# Patient Record
Sex: Female | Born: 1975 | Race: White | Hispanic: No | Marital: Married | Smoking: Current every day smoker
Health system: Southern US, Community
[De-identification: ages and names within clinical notes are randomized; demographics above are authoritative.]

## PROBLEM LIST (undated history)

## (undated) DIAGNOSIS — Z8489 Family history of other specified conditions: Secondary | ICD-10-CM

## (undated) DIAGNOSIS — F8081 Childhood onset fluency disorder: Secondary | ICD-10-CM

## (undated) DIAGNOSIS — F419 Anxiety disorder, unspecified: Secondary | ICD-10-CM

## (undated) HISTORY — DX: Anxiety disorder, unspecified: F41.9

## (undated) HISTORY — DX: Childhood onset fluency disorder: F80.81

---

## 2002-03-07 ENCOUNTER — Other Ambulatory Visit: Admission: RE | Admit: 2002-03-07 | Discharge: 2002-03-07 | Payer: Self-pay | Admitting: Obstetrics and Gynecology

## 2002-10-14 ENCOUNTER — Inpatient Hospital Stay (HOSPITAL_COMMUNITY): Admission: AD | Admit: 2002-10-14 | Discharge: 2002-10-17 | Payer: Self-pay | Admitting: Obstetrics and Gynecology

## 2002-10-15 ENCOUNTER — Encounter (INDEPENDENT_AMBULATORY_CARE_PROVIDER_SITE_OTHER): Payer: Self-pay | Admitting: Specialist

## 2003-06-10 ENCOUNTER — Other Ambulatory Visit: Admission: RE | Admit: 2003-06-10 | Discharge: 2003-06-10 | Payer: Self-pay | Admitting: Obstetrics and Gynecology

## 2004-07-20 ENCOUNTER — Other Ambulatory Visit: Admission: RE | Admit: 2004-07-20 | Discharge: 2004-07-20 | Payer: Self-pay | Admitting: Obstetrics and Gynecology

## 2005-07-21 ENCOUNTER — Other Ambulatory Visit: Admission: RE | Admit: 2005-07-21 | Discharge: 2005-07-21 | Payer: Self-pay | Admitting: Obstetrics and Gynecology

## 2007-05-23 ENCOUNTER — Inpatient Hospital Stay (HOSPITAL_COMMUNITY): Admission: AD | Admit: 2007-05-23 | Discharge: 2007-05-27 | Payer: Self-pay | Admitting: Obstetrics & Gynecology

## 2007-05-28 ENCOUNTER — Encounter: Admission: RE | Admit: 2007-05-28 | Discharge: 2007-06-27 | Payer: Self-pay | Admitting: Obstetrics & Gynecology

## 2007-06-28 ENCOUNTER — Encounter: Admission: RE | Admit: 2007-06-28 | Discharge: 2007-07-28 | Payer: Self-pay | Admitting: Obstetrics and Gynecology

## 2007-07-29 ENCOUNTER — Encounter: Admission: RE | Admit: 2007-07-29 | Discharge: 2007-08-27 | Payer: Self-pay | Admitting: Obstetrics and Gynecology

## 2007-08-28 ENCOUNTER — Encounter: Admission: RE | Admit: 2007-08-28 | Discharge: 2007-09-27 | Payer: Self-pay | Admitting: Obstetrics and Gynecology

## 2007-09-28 ENCOUNTER — Encounter: Admission: RE | Admit: 2007-09-28 | Discharge: 2007-10-28 | Payer: Self-pay | Admitting: Obstetrics and Gynecology

## 2007-10-29 ENCOUNTER — Encounter: Admission: RE | Admit: 2007-10-29 | Discharge: 2007-11-05 | Payer: Self-pay | Admitting: Obstetrics and Gynecology

## 2011-02-08 NOTE — Op Note (Signed)
Anna Richardson, Anna Richardson             ACCOUNT NO.:  [id number]   MEDICAL RECORD NO.:  [id number]          PATIENT TYPE:  INP   LOCATION:  9302                          FACILITY:  [facility]   PHYSICIAN:  Gerrit Friends. Aldona Bar, M.D.   DATE OF BIRTH:  08/11/1976   DATE OF PROCEDURE:  05/23/2007  DATE OF DISCHARGE:                               OPERATIVE REPORT   PREOPERATIVE DIAGNOSIS:  36-week plus intrauterine pregnancy, previous  cesarean section, spontaneous rupture of membranes, desire for repeat  cesarean section.   POSTOPERATIVE DIAGNOSIS:  36-week plus intrauterine pregnancy, previous  cesarean section, spontaneous rupture of membranes, desire for repeat  cesarean section, delivery of 7 pound female infant, Apgars 8/9.   PROCEDURE:  Repeat low transverse cesarean section.   SURGEON:  Gerrit Friends. Aldona Bar, M.D.   ANESTHESIA:  Subarachnoid block.   HISTORY:  This 35 year old gravida 2, para 1, was originally scheduled  for repeat cesarean section on September 18.  Due date was September 27.  At approximately 6:20 p.m. on the evening of August 27, she had  spontaneous rupture of membranes - clear fluid, and presented to triage  for evaluation.  She was found to have ruptured membranes.  Her group B  Strep status was unknown and, therefore, she received a dose of 2 grams  of ampicillin at least several hours preoperatively.  Fetal heart rate  was, indeed, reactive and the patient was having some contractions.  She  was ultimately taken to the operating room for cesarean section, repeat,  as requested.   DESCRIPTION OF PROCEDURE:  The patient was taken to the operating room  where, after the satisfactory induction of spinal anesthetic, she was  prepped and draped in the usual fashion with a Foley catheter inserted.  At this time, after the patient was adequately draped and good  anesthetic levels were documented, the procedure was begun.  A  Pfannenstiel incision was made dissecting down sharply to  and through  the old scar without difficulty.  Hemostasis was created in each layer.  The fascia was incised in a low transverse fashion, subfascial space was  created inferiorly and superiorly, muscles separated in the midline, the  peritoneum identified and appropriate care taken to avoid the bowel  superiorly and the bladder inferiorly.   At this time, the vesicouterine peritoneum was incised a low transverse  fashion and pushed off the lower uterine segment with ease.  Sharp  incision of the uterus was then made in a low transverse fashion with  Metzenbaum scissors and extended laterally.  The remaining amniotic  fluid was drained and, at this time with minimal difficulty, a viable  female infant was delivered from vertex position.  The infant cried  spontaneously at once and after the cord was clamped and cut, the infant  was passed off to the awaiting team and, thereafter, taken to the  regular nursery in good condition.  Weight was 7 pounds even and Apgars  were noted to be 8/9.   After cord bloods were collected, the placenta was delivered intact.  The uterus was then exteriorized, rendered free of any  remaining  products conception, and with good uterine contractility afforded by  slowly giving intravenous Pitocin and manual stimulation, the uterine  incision was then closed using a single layer of #1 Vicryl in a running  locking fashion.  This was oversewn with several figure-of-eight #1  Vicryls.  The tubes and ovaries appeared normal and, at this time, the  uterine incision was noted to be dry and the uterus was well contracted.  The uterus, at this time, was replaced into the abdominal incision after  the abdomen was lavaged of all free blood and clot.   With all counts being correct and no foreign bodies noted remaining in  the abdominal cavity, closure of the abdomen was begun in layers.  The  abdominal peritoneum was closed with 0 Vicryl in a running fashion and  muscles  secured with same.  Assured of good fascial hemostasis, the  fascia was then reapproximated with 0 Vicryl from angle to midline  bilaterally.  The subcutaneous tissues were rendered hemostatic and  staples were used to close the skin.  A sterile pressure dressing was  applied and the patient, at this time, was transported to recovery in  satisfactory condition having tolerated the procedure well.  Estimated  blood loss 500 mL.  All counts correct x2.   CONCLUSION:  This patient presented with ruptured membranes at 36 weeks  plus gestation having had a previous cesarean section and was desirous  of a repeat cesarean section and, indeed, was even contracting some at  the time that she presented to the triage area.  She was taken to the  operating room and delivered by repeat low transverse cesarean section  of a 7 pounds female infant with Apgars of 8/9 and at the conclusion of  the procedure, both mother and baby were doing well in their respective  recovery areas.      Gerrit Friends. Aldona Bar, M.D.  Electronically Signed     RMW/MEDQ  D:  05/23/2007  T:  05/24/2007  Job:  [id number]

## 2011-02-08 NOTE — Discharge Summary (Signed)
Anna Richardson, Anna Richardson             ACCOUNT NO.:  [id number]   MEDICAL RECORD NO.:  [id number]          PATIENT TYPE:  INP   LOCATION:  9302                          [facility]ACILITY:  [facility]   PHYSICIAN:  Carrington Clamp, M.D. DATE OF BIRTH:  1976/05/13   DATE OF ADMISSION:  05/23/2007  DATE OF DISCHARGE:  05/27/2007                               DISCHARGE SUMMARY   FINAL DIAGNOSES:  1. Intrauterine pregnancy at 36+ weeks gestation.  2. History of previous cesarean section.  3. The patient desires repeat cesarean section.  4. Spontaneous rupture of membranes.   PROCEDURE:  Repeat low transverse cesarean section.   SURGEON:  Dr. Annamaria Helling.   COMPLICATIONS:  None.   HISTORY OF PRESENT ILLNESS:  This is 35 year old G2, P1 was originally  scheduled for a repeat cesarean section on September 18, but on the  evening of August 27, the patient had spontaneous rupture of membranes  and presented to the [facility] for evaluation.  The patient's  antepartum course up to this point had been uncomplicated.  Her first  born child did have a cleft lip and she was monitored with ultrasounds  at [facility], which showed no signs of any abnormalities.   HOSPITAL COURSE:  She presents at this time to the [facility] with  rupture of membranes.  Her group B strep status was unknown and  therefore she was started on 2 g of ampicillin.  At this point, a  discussion was held with the patient regarding her options and a  decision was made to proceed with a repeat cesarean section.  She was  taken to the operating room on May 23, 2007 by Dr. Annamaria Helling, where  a repeat low transverse cesarean section was performed with the delivery  of a 7-pound 0-ounce female infant with Apgars of 8 and 9.  Delivery went  without complications.  The baby did go to the NICU for some breathing  difficulties.  The patient's postoperative course was benign without any  significant fevers.  She was felt ready for  discharge on postoperative  day #4.  The baby was still in the NICU.  She was sent home on a regular  diet, told to decrease activities, told to continue her vitamins and was  given prescriptions for her pain medicines; I do not see which ones were  given.  She was to follow up in our office in 4-6 weeks.  She will be  staying at the hospital with the baby in the NICU.   LABORATORY DATA ON DISCHARGE:  At this point, she has a hemoglobin of  10.2, white blood cell count of 12.8 and platelets of 222,000.      Leilani Able, P.A.-C.      Carrington Clamp, M.D.  Electronically Signed    MB/MEDQ  D:  07/04/2007  T:  07/05/2007  Job:  [id number]

## 2011-05-31 ENCOUNTER — Other Ambulatory Visit: Payer: Self-pay | Admitting: Obstetrics and Gynecology

## 2011-07-08 LAB — CBC
HCT: 29.7 — ABNORMAL LOW
Hemoglobin: 11.4 — ABNORMAL LOW
MCHC: 35
MCV: 89.4
MCV: 89.9
Platelets: 222
RBC: 3.63 — ABNORMAL LOW
RDW: 13.2
RDW: 13.7

## 2011-07-08 LAB — RPR: RPR Ser Ql: NONREACTIVE

## 2012-02-05 ENCOUNTER — Ambulatory Visit (INDEPENDENT_AMBULATORY_CARE_PROVIDER_SITE_OTHER): Payer: 59 | Admitting: Physician Assistant

## 2012-02-05 VITALS — BP 127/85 | HR 99 | Temp 99.5°F | Resp 16 | Ht 62.5 in | Wt 198.0 lb

## 2012-02-05 DIAGNOSIS — J029 Acute pharyngitis, unspecified: Secondary | ICD-10-CM

## 2012-02-05 DIAGNOSIS — R509 Fever, unspecified: Secondary | ICD-10-CM

## 2012-02-05 LAB — POCT CBC
Granulocyte percent: 81.3 %G — AB (ref 37–80)
HCT, POC: 40.9 % (ref 37.7–47.9)
Hemoglobin: 13.4 g/dL (ref 12.2–16.2)
POC Granulocyte: 5.7 (ref 2–6.9)

## 2012-02-05 MED ORDER — MAGIC MOUTHWASH W/LIDOCAINE: 5.0000 mL | ORAL | Status: DC | PRN

## 2012-02-05 MED ORDER — AMOXICILLIN 875 MG PO TABS: 875.0000 mg | ORAL_TABLET | Freq: Two times a day (BID) | ORAL | Status: AC

## 2012-02-05 NOTE — Progress Notes (Signed)
  Subjective:    Patient ID: Anna Richardson, female    DOB: [birth year], 36 y.o.   MRN: [id number]  HPI  Anna Richardson comes in today c/o myalgias, fever (tmax 101), ST and cough for 3 days.  One episode of diarrhea but no n/v.  No SOB, wheezing.  She is a Manufacturing systems engineer.  She smokes 1/2 ppd and is healthy otherwise. She did not have a flu shot this year. No tick exposure.  Review of Systems  Constitutional: Positive for fever, chills and fatigue.  HENT: Positive for sore throat. Negative for congestion.   Respiratory: Positive for cough.   Gastrointestinal: Positive for diarrhea. Negative for nausea and vomiting.  Musculoskeletal: Positive for myalgias.  Neurological: Positive for headaches.       Objective:   Physical Exam  Constitutional: She is oriented to person, place, and time. She appears well-developed and well-nourished.  HENT:  Right Ear: Tympanic membrane normal.  Left Ear: Tympanic membrane normal.  Nose: No mucosal edema.  Mouth/Throat: Oropharyngeal exudate (one spot on left tonsil), posterior oropharyngeal edema and posterior oropharyngeal erythema present.  Cardiovascular: Normal rate and regular rhythm.   Pulmonary/Chest: Effort normal and breath sounds normal.  Lymphadenopathy:    She has no cervical adenopathy.  Neurological: She is alert and oriented to person, place, and time.  Skin: Skin is warm.     Results for orders placed in visit on 02/05/12  POCT RAPID STREP A (OFFICE)      Component Value Range   Rapid Strep A Screen Negative  Negative   POCT CBC      Component Value Range   WBC 7.0  4.6 - 10.2 (K/uL)   Lymph, poc 0.9  0.6 - 3.4    POC LYMPH PERCENT 12.6  10 - 50 (%L)   MID (cbc) 0.4  0 - 0.9    POC MID % 6.1  0 - 12 (%M)   POC Granulocyte 5.7  2 - 6.9    Granulocyte percent 81.3 (*) 37 - 80 (%G)   RBC 4.35  4.04 - 5.48 (M/uL)   Hemoglobin 13.4  12.2 - 16.2 (g/dL)   HCT, POC 30.8  65.7 - 47.9 (%)   MCV 94.1  80 - 97 (fL)   MCH, POC  30.8  27 - 31.2 (pg)   MCHC 32.8  31.8 - 35.4 (g/dL)   RDW, POC 84.6     Platelet Count, POC 267  142 - 424 (K/uL)   MPV 9.1  0 - 99.8 (fL)        Assessment & Plan:  Fever Myalgias Pharyngitis  Amoxicillin, [employer] Magic MW, motrin.  Out of work tomorrow.  Call if symptoms worsen.

## 2012-02-07 ENCOUNTER — Ambulatory Visit (INDEPENDENT_AMBULATORY_CARE_PROVIDER_SITE_OTHER): Payer: 59 | Admitting: Physician Assistant

## 2012-02-07 VITALS — BP 127/80 | HR 82 | Temp 98.2°F | Resp 16 | Ht 64.5 in | Wt 185.0 lb

## 2012-02-07 DIAGNOSIS — J019 Acute sinusitis, unspecified: Secondary | ICD-10-CM

## 2012-02-07 DIAGNOSIS — R059 Cough, unspecified: Secondary | ICD-10-CM

## 2012-02-07 DIAGNOSIS — R05 Cough: Secondary | ICD-10-CM

## 2012-02-07 DIAGNOSIS — J029 Acute pharyngitis, unspecified: Secondary | ICD-10-CM

## 2012-02-07 MED ORDER — HYDROCOD POLST-CHLORPHEN POLST 10-8 MG/5ML PO LQCR: 5.0000 mL | Freq: Two times a day (BID) | ORAL | Status: DC | PRN

## 2012-02-07 MED ORDER — CEFDINIR 300 MG PO CAPS: 600.0000 mg | ORAL_CAPSULE | Freq: Every day | ORAL | Status: AC

## 2012-02-07 MED ORDER — GUAIFENESIN ER 1200 MG PO TB12: 1.0000 | ORAL_TABLET | Freq: Two times a day (BID) | ORAL | Status: DC | PRN

## 2012-02-07 MED ORDER — PREDNISONE 20 MG PO TABS: ORAL_TABLET | ORAL | Status: AC

## 2012-02-07 NOTE — Progress Notes (Signed)
  Subjective:    Patient ID: Anna Richardson, female    DOB: March 08, 1976, 36 y.o.   MRN: [id number]  HPI Presents with continued illness. Seen 02/04/2012 with sore throat, fever, body aches.  RS was negative and CBC normal, but covered for strep pharyngitis with Amoxicillin.  Has not used the MMW.  Uses ibuprofen/acetaminophen for throat pain. Feels no better. Low grade fever continues.  Some post-nasal drainage. Cough is non-productive, but keeps her awake at night. No additional GI symptoms.  No GU symptoms.  Review of Systems As above.    Objective:   Physical Exam Vital signs noted. Well-developed, well nourished WF who is awake, alert and oriented, in NAD. HEENT: [place]/AT, PERRL, EOMI.  Sclera and conjunctiva are clear.  EAC are patent, TMs are normal in appearance. Nasal mucosa is pink and moist. OP is clear, though mildly erythematous.  No exudates present today. Tenderness with palpation over the maxillary sinuses. Neck: supple, no lymphadenopathy, thyromegaly. Mildly tender over the left tonsillar area, but no discrete lymphadenopathy. Heart: RRR, no murmur Lungs: CTA Extremities: no cyanosis, clubbing or edema. Skin: warm and dry without rash.     Assessment & Plan:   1. Acute sinusitis, unspecified  cefdinir (OMNICEF) 300 MG capsule, Guaifenesin (MUCINEX MAXIMUM STRENGTH) 1200 MG TB12, predniSONE (DELTASONE) 20 MG tablet  2. Acute pharyngitis  predniSONE (DELTASONE) 20 MG tablet  3. Cough  chlorpheniramine-HYDROcodone (TUSSIONEX PENNKINETIC ER) 10-8 MG/5ML LQCR  Anticipatory guidance.  Supportive care. Re-evaluate if symptoms worsen/persist.

## 2012-02-07 NOTE — Patient Instructions (Signed)
Get lots of rest and drink at least 64 ounces of water daily. Do not take ibuprofen while you are taking the prednisone, but you may safely take acetaminophen (Tylenol).

## 2012-12-26 ENCOUNTER — Ambulatory Visit (INDEPENDENT_AMBULATORY_CARE_PROVIDER_SITE_OTHER): Payer: 59 | Admitting: Family Medicine

## 2012-12-26 VITALS — BP 122/80 | HR 63 | Temp 98.0°F | Resp 16 | Ht 64.0 in | Wt 193.0 lb

## 2012-12-26 DIAGNOSIS — R197 Diarrhea, unspecified: Secondary | ICD-10-CM

## 2012-12-26 DIAGNOSIS — K5289 Other specified noninfective gastroenteritis and colitis: Secondary | ICD-10-CM

## 2012-12-26 DIAGNOSIS — K529 Noninfective gastroenteritis and colitis, unspecified: Secondary | ICD-10-CM

## 2012-12-26 LAB — POCT CBC
Granulocyte percent: 72.3 %G (ref 37–80)
MCH, POC: 29.9 pg (ref 27–31.2)
MID (cbc): 0.3 (ref 0–0.9)
MPV: 9 fL (ref 0–99.8)
POC MID %: 6.1 %M (ref 0–12)
Platelet Count, POC: 270 10*3/uL (ref 142–424)
RBC: 3.88 M/uL — AB (ref 4.04–5.48)
WBC: 5.2 10*3/uL (ref 4.6–10.2)

## 2012-12-26 MED ORDER — LOPERAMIDE HCL 2 MG PO CAPS: 2.0000 mg | ORAL_CAPSULE | Freq: Four times a day (QID) | ORAL | Status: DC | PRN

## 2012-12-26 NOTE — Patient Instructions (Addendum)
Iron-Rich Diet  An iron-rich diet contains foods that are good sources of iron. Iron is an important mineral that helps your body produce hemoglobin. Hemoglobin is a protein in red blood cells that carries oxygen to the body's tissues. Sometimes, the iron level in your blood can be low. This may be caused by:  · A lack of iron in your diet.  · Blood loss.  · Times of growth, such as during pregnancy or during a child's growth and development.  Low levels of iron can cause a decrease in the number of red blood cells. This can result in iron deficiency anemia. Iron deficiency anemia symptoms include:  · Tiredness.  · Weakness.  · Irritability.  · Increased chance of infection.  Here are some recommendations for daily iron intake:  · Males older than 36 years of age need 8 mg of iron per day.  · Women ages 19 to 50 need 18 mg of iron per day.  · Pregnant women need 27 mg of iron per day, and women who are over 19 years of age and breastfeeding need 9 mg of iron per day.  · Women over the age of 50 need 8 mg of iron per day.  SOURCES OF IRON  There are 2 types of iron that are found in food: heme iron and nonheme iron. Heme iron is absorbed by the body better than nonheme iron. Heme iron is found in meat, poultry, and fish. Nonheme iron is found in grains, beans, and vegetables.  Heme Iron Sources  Food / Iron (mg)  · Chicken liver, 3 oz (85 g)/ 10 mg  · Beef liver, 3 oz (85 g)/ 5.5 mg  · Oysters, 3 oz (85 g)/ 8 mg  · Beef, 3 oz (85 g)/ 2 to 3 mg  · Shrimp, 3 oz (85 g)/ 2.8 mg  · Turkey, 3 oz (85 g)/ 2 mg  · Chicken, 3 oz (85 g) / 1 mg  · Fish (tuna, halibut), 3 oz (85 g)/ 1 mg  · Pork, 3 oz (85 g)/ 0.9 mg  Nonheme Iron Sources  Food / Iron (mg)  · Ready-to-eat breakfast cereal, iron-fortified / 3.9 to 7 mg  · Tofu, ½ cup / 3.4 mg  · Kidney beans, ½ cup / 2.6 mg  · Baked potato with skin / 2.7 mg  · Asparagus, ½ cup / 2.2 mg  · Avocado / 2 mg  · Dried peaches, ½ cup / 1.6 mg  · Raisins, ½ cup / 1.5 mg  · Soy milk, 1 cup  / 1.5 mg  · Whole-wheat bread, 1 slice / 1.2 mg  · Spinach, 1 cup / 0.8 mg  · Broccoli, ½ cup / 0.6 mg  IRON ABSORPTION  Certain foods can decrease the body's absorption of iron. Try to avoid these foods and beverages while eating meals with iron-containing foods:  · Coffee.  · Tea.  · Fiber.  · Soy.  Foods containing vitamin C can help increase the amount of iron your body absorbs from iron sources, especially from nonheme sources. Eat foods with vitamin C along with iron-containing foods to increase your iron absorption. Foods that are high in vitamin C include many fruits and vegetables. Some good sources are:  · Fresh orange juice.  · Oranges.  · Strawberries.  · Mangoes.  · Grapefruit.  · Red bell peppers.  · Green bell peppers.  · Broccoli.  · Potatoes with skin.  · Tomato juice.  Document 

## 2012-12-26 NOTE — Progress Notes (Signed)
Subjective:    Patient ID: Anna Richardson, female    DOB: September 30, 1975, 37 y.o.   MRN: [id number] Chief Complaint  Patient presents with  . Emesis    since sunday  . Diarrhea  . Generalized Body Aches  . Headache     HPI Stomach pains since and diarrhea since Sun - 3 days ago so today is day 4 and the diarrhea does not seem to be slacking off - though she currently has not had a BM in 4 hours which is a sig improvement.  Did vomit one time only on Monday. Otherwise is able to tolerate po - just not much of an appetite due to stomach pains. Is trying to push fluids but hard as they are just running right through her. Did feel chills and achy, some sweats. She is a Manufacturing systems engineer.  Stools are large volume and has about 10 episodes throughout the day - they are orange, watery, and odiferous.  Abd pain is central and colicky - worse when she needs to have a stool. No recent antibiotic use.   No travels or unusual foods. No otc meds.   Is on day 4 of illness and not slowing down.   Past Medical History  Diagnosis Date  . Anxiety     situational  . Stutter    Current Outpatient Prescriptions on File Prior to Visit  Medication Sig Dispense Refill  . norgestimate-ethinyl estradiol (ORTHO-CYCLEN,SPRINTEC,PREVIFEM) 0.25-35 MG-MCG tablet Take 1 tablet by mouth daily.      . Alum & Mag Hydroxide-Simeth (MAGIC MOUTHWASH W/LIDOCAINE) SOLN Take 5 mLs by mouth as needed.  120 mL  0  . chlorpheniramine-HYDROcodone (TUSSIONEX PENNKINETIC ER) 10-8 MG/5ML LQCR Take 5 mLs by mouth every 12 (twelve) hours as needed (cough).  60 mL  0  . Guaifenesin (MUCINEX MAXIMUM STRENGTH) 1200 MG TB12 Take 1 tablet (1,200 mg total) by mouth every 12 (twelve) hours as needed.  14 tablet  1   No current facility-administered medications on file prior to visit.   Allergies  Allergen Reactions  . Ciprofloxacin     EYE DROPS Throat swelling     Review of Systems  Constitutional: Positive for chills, diaphoresis,  activity change, appetite change and fatigue. Negative for fever and unexpected weight change.  Respiratory: Negative for shortness of breath.   Cardiovascular: Negative for chest pain and leg swelling.  Gastrointestinal: Positive for nausea, vomiting, abdominal pain and diarrhea. Negative for constipation, blood in stool, abdominal distention, anal bleeding and rectal pain.  Genitourinary: Negative for dysuria, decreased urine volume and difficulty urinating.  Musculoskeletal: Positive for myalgias. Negative for gait problem.  Skin: Negative for rash.  Hematological: Negative for adenopathy.  Psychiatric/Behavioral: Positive for sleep disturbance.      BP 122/80  Pulse 63  Temp(Src) 98 F (36.7 C) (Oral)  Resp 16  Ht 5\' 4"  (1.626 m)  Wt 193 lb (87.544 kg)  BMI 33.11 kg/m2  SpO2 98%  LMP 12/12/2012 Objective:   Physical Exam  Constitutional: She is oriented to person, place, and time. She appears well-developed and well-nourished. No distress.  HENT:  Head: Normocephalic and atraumatic.  Neck: Normal range of motion. Neck supple. No thyromegaly present.  Cardiovascular: Normal rate, regular rhythm, normal heart sounds and intact distal pulses.   Pulmonary/Chest: Effort normal and breath sounds normal. No respiratory distress.  Abdominal: Soft. Normal appearance. She exhibits no distension and no mass. Bowel sounds are increased. There is no hepatosplenomegaly. There is generalized tenderness. There  is no rigidity, no rebound, no guarding, no CVA tenderness, no tenderness at McBurney's point and negative Murphy's sign. No hernia.  Musculoskeletal: She exhibits no edema.  Lymphadenopathy:    She has no cervical adenopathy.  Neurological: She is alert and oriented to person, place, and time.  Skin: Skin is warm and dry. She is not diaphoretic. No erythema.  Psychiatric: She has a normal mood and affect. Her behavior is normal.      Results for orders placed in visit on 12/26/12   POCT CBC      Result Value Range   WBC 5.2  4.6 - 10.2 K/uL   Lymph, poc 1.1  0.6 - 3.4   POC LYMPH PERCENT 21.6  10 - 50 %L   MID (cbc) 0.3  0 - 0.9   POC MID % 6.1  0 - 12 %M   POC Granulocyte 3.8  2 - 6.9   Granulocyte percent 72.3  37 - 80 %G   RBC 3.88 (*) 4.04 - 5.48 M/uL   Hemoglobin 11.6 (*) 12.2 - 16.2 g/dL   HCT, POC 16.1 (*) 09.6 - 47.9 %   MCV 95.1  80 - 97 fL   MCH, POC 29.9  27 - 31.2 pg   MCHC 31.4 (*) 31.8 - 35.4 g/dL   RDW, POC 04.5     Platelet Count, POC 270  142 - 424 K/uL   MPV 9.0  0 - 99.8 fL    Assessment & Plan:  Diarrhea - Plan: loperamide (IMODIUM) 2 MG capsule  Gastroenteritis - Plan: POCT CBC - likely viral due to her work and reassuring that she can keep hydrated. Offered pt to proceed w/ stool studies since she is on day 4 of illness but she declines. Will start the imodium and if not sig improved in the next 2d, she will then return to clinic for recheck and consider check stool culture, fecal leuks, hemosure, c. Diff.  Meds ordered this encounter  Medications  . citalopram (CELEXA) 10 MG tablet    Sig: Take 10 mg by mouth daily.  Marland Kitchen loperamide (IMODIUM) 2 MG capsule    Sig: Take 1 capsule (2 mg total) by mouth 4 (four) times daily as needed for diarrhea or loose stools.    Dispense:  30 capsule    Refill:  0

## 2013-08-08 ENCOUNTER — Other Ambulatory Visit: Payer: Self-pay | Admitting: Obstetrics and Gynecology

## 2014-11-03 ENCOUNTER — Other Ambulatory Visit: Payer: Self-pay | Admitting: Obstetrics and Gynecology

## 2014-11-04 LAB — CYTOLOGY - PAP

## 2015-08-05 ENCOUNTER — Encounter: Payer: Self-pay | Admitting: Family Medicine

## 2015-08-12 ENCOUNTER — Ambulatory Visit (INDEPENDENT_AMBULATORY_CARE_PROVIDER_SITE_OTHER): Payer: BLUE CROSS/BLUE SHIELD | Admitting: Pediatrics

## 2015-08-12 ENCOUNTER — Encounter: Payer: Self-pay | Admitting: Pediatrics

## 2015-08-12 VITALS — BP 124/78 | HR 81 | Temp 98.5°F | Ht 64.0 in | Wt 186.0 lb

## 2015-08-12 DIAGNOSIS — Z0000 Encounter for general adult medical examination without abnormal findings: Secondary | ICD-10-CM

## 2015-08-12 DIAGNOSIS — Z6831 Body mass index (BMI) 31.0-31.9, adult: Secondary | ICD-10-CM | POA: Diagnosis not present

## 2015-08-12 DIAGNOSIS — Z111 Encounter for screening for respiratory tuberculosis: Secondary | ICD-10-CM

## 2015-08-12 DIAGNOSIS — Z720 Tobacco use: Secondary | ICD-10-CM

## 2015-08-12 DIAGNOSIS — F411 Generalized anxiety disorder: Secondary | ICD-10-CM

## 2015-08-12 DIAGNOSIS — R1032 Left lower quadrant pain: Secondary | ICD-10-CM

## 2015-08-12 DIAGNOSIS — L680 Hirsutism: Secondary | ICD-10-CM

## 2015-08-12 NOTE — Progress Notes (Signed)
Subjective:    Patient ID: Anna Richardson, female    DOB: 1976/04/30, 39 y.o.   MRN: [id number]  CC: CPE  HPI: Anna Richardson is a 39 y.o. female presenting on 08/12/2015 for Annual Exam  Has had period for past 3 weeks, daily spotting, not as heavy as regular period Last OCP change 9-10 months ago. Not missing any pills that she knows of No cramping or pain Followed by [facility] gynecology, Dr. Ouida Sills  On spironolactone for hair growth on her chin, not helping a lot No history of PCOS Pain LLQ at times, not crippling, comes and goes for past week No pain with intercourse  Anxiety: citalopram helps, no further anxiety attacks. Symptoms stable  Quitting smoking in January. About 15 cigarettes a day now Plan to quit in January  BMI 31: Not lost weight in 2 months, phentermine for past 9-10 months fro gynecologist  Mom dx at 61yo with breast cancer, pt getting mammograms regularly  ROS: All systems negative other than what is in HPI  Past Medical History anxiety  Social History   Social History  . Marital Status: Married    Spouse Name: N/A  . Number of Children: N/A  . Years of Education: N/A   Occupational History  . Not on file.   Social History Main Topics  . Smoking status: Current Every Day Smoker -- 0.50 packs/day    Types: Cigarettes  . Smokeless tobacco: Not on file  . Alcohol Use: 1.2 oz/week    2 Cans of beer per week  . Drug Use: No  . Sexual Activity: Yes    Birth Control/ Protection: Pill   Other Topics Concern  . Not on file   Social History Narrative   Family History  Problem Relation Age of Onset  . Cancer Mother 33    breast      Current Outpatient Prescriptions  Medication Sig Dispense Refill  . citalopram (CELEXA) 20 MG tablet   6  . [facility] 1/35 1-35 MG-MCG tablet   7  . phentermine 30 MG capsule Take 30 mg by mouth every morning.    Marland Kitchen spironolactone (ALDACTONE) 100 MG tablet   3   No current  facility-administered medications for this visit.       Objective:    BP 124/78 mmHg  Pulse 81  Temp(Src) 98.5 F (36.9 C) (Oral)  Ht 5' 4" (1.626 m)  Wt 186 lb (84.369 kg)  BMI 31.91 kg/m2  PF 12478 L/min  Wt Readings from Last 3 Encounters:  08/12/15 186 lb (84.369 kg)  12/26/12 193 lb (87.544 kg)  02/07/12 185 lb (83.915 kg)    Gen: NAD, alert, cooperative with exam, NCAT, stutters at times EYES: EOMI, no scleral injection or icterus ENT:  TMs pearly gray b/l, OP without erythema LYMPH: no cervical LAD CV: NRRR, normal S1/S2, no murmur, distal pulses 2+ b/l Resp: CTABL, no wheezes, normal WOB Abd: +BS, soft, NTND. no guarding or organomegaly Ext: No edema, warm Neuro: Alert and oriented, strength equal b/l UE and LE, coordination grossly normal MSK: normal muscle bulk     Assessment & Plan:   Bricelyn was seen today for annual exam.  Diagnoses and all orders for this visit:  Encounter for preventive health examination -     BMP8+EGFR -     Vitamin D, 25-hydroxy -     CBC -     TB Skin Test  Tobacco abuse over 3 minutes spent discussing  cessation strategies, plan for decreasing total # of cig leading up to quit date, identifying stressful times and when she wants to smoke.  Generalized anxiety disorder well controlled, continue citalopram  Left lower quadrant pain Comes and goes, not clear etiology, normal stooling, normal appetite. If still present at next visit consider ultrasound.  BMI 31.0-31.9,adult discussed lifestyle changes, minimizing snacking and sugary beverages, walking 30 minutes 5 days a week  Hirsutism continue spironolactone. Followed by gynecologist   Follow up plan: Return in about 8 weeks (around 10/07/2015).  Assunta Found, MD [facility] 08/12/2015, 3:38 PM

## 2015-08-13 LAB — BMP8+EGFR
BUN/Creatinine Ratio: 25 — ABNORMAL HIGH (ref 8–20)
BUN: 19 mg/dL (ref 6–20)
CALCIUM: 8.9 mg/dL (ref 8.7–10.2)
CHLORIDE: 101 mmol/L (ref 97–106)
CO2: 26 mmol/L (ref 18–29)
Creatinine, Ser: 0.77 mg/dL (ref 0.57–1.00)
GFR calc Af Amer: 113 mL/min/{1.73_m2} (ref >59)
GFR calc non Af Amer: 98 mL/min/{1.73_m2} (ref >59)
Glucose: 89 mg/dL (ref 65–99)
POTASSIUM: 4.7 mmol/L (ref 3.5–5.2)
SODIUM: 139 mmol/L (ref 136–144)

## 2015-08-13 LAB — CBC
Hematocrit: 38.2 % (ref 34.0–46.6)
Hemoglobin: 12.8 g/dL (ref 11.1–15.9)
MCH: 31 pg (ref 26.6–33.0)
MCHC: 33.5 g/dL (ref 31.5–35.7)
MCV: 93 fL (ref 79–97)
PLATELETS: 368 10*3/uL (ref 150–379)
RBC: 4.13 x10E6/uL (ref 3.77–5.28)
RDW: 12.9 % (ref 12.3–15.4)
WBC: 6.7 10*3/uL (ref 3.4–10.8)

## 2015-08-13 LAB — VITAMIN D 25 HYDROXY (VIT D DEFICIENCY, FRACTURES): VIT D 25 HYDROXY: 40.4 ng/mL (ref 30.0–100.0)

## 2015-08-15 DIAGNOSIS — F411 Generalized anxiety disorder: Secondary | ICD-10-CM | POA: Insufficient documentation

## 2015-08-15 DIAGNOSIS — R1032 Left lower quadrant pain: Secondary | ICD-10-CM | POA: Insufficient documentation

## 2015-08-15 DIAGNOSIS — Z720 Tobacco use: Secondary | ICD-10-CM | POA: Insufficient documentation

## 2015-08-15 DIAGNOSIS — L680 Hirsutism: Secondary | ICD-10-CM | POA: Insufficient documentation

## 2015-08-15 DIAGNOSIS — Z6835 Body mass index (BMI) 35.0-35.9, adult: Secondary | ICD-10-CM | POA: Insufficient documentation

## 2015-09-07 ENCOUNTER — Ambulatory Visit (INDEPENDENT_AMBULATORY_CARE_PROVIDER_SITE_OTHER): Payer: BLUE CROSS/BLUE SHIELD | Admitting: Family

## 2015-09-07 ENCOUNTER — Encounter: Payer: Self-pay | Admitting: Family

## 2015-09-07 VITALS — BP 111/71 | HR 73 | Temp 98.6°F | Ht 64.0 in | Wt 188.0 lb

## 2015-09-07 DIAGNOSIS — J0110 Acute frontal sinusitis, unspecified: Secondary | ICD-10-CM | POA: Diagnosis not present

## 2015-09-07 MED ORDER — MOMETASONE FUROATE 50 MCG/ACT NA SUSP: 2.0000 | Freq: Every day | NASAL | Status: DC

## 2015-09-07 MED ORDER — AMOXICILLIN-POT CLAVULANATE 875-125 MG PO TABS
1.0000 | ORAL_TABLET | Freq: Two times a day (BID) | ORAL | Status: DC
Start: 2015-09-07 — End: 2015-12-08

## 2015-09-07 NOTE — Patient Instructions (Signed)
Sinusitis, Adult Sinusitis is redness, soreness, and inflammation of the paranasal sinuses. Paranasal sinuses are air pockets within the bones of your face. They are located beneath your eyes, in the middle of your forehead, and above your eyes. In healthy paranasal sinuses, mucus is able to drain out, and air is able to circulate through them by way of your nose. However, when your paranasal sinuses are inflamed, mucus and air can become trapped. This can allow bacteria and other germs to grow and cause infection. Sinusitis can develop quickly and last only a short time (acute) or continue over a long period (chronic). Sinusitis that lasts for more than 12 weeks is considered chronic. CAUSES Causes of sinusitis include:  Allergies.  Structural abnormalities, such as displacement of the cartilage that separates your nostrils (deviated septum), which can decrease the air flow through your nose and sinuses and affect sinus drainage.  Functional abnormalities, such as when the small hairs (cilia) that line your sinuses and help remove mucus do not work properly or are not present. SIGNS AND SYMPTOMS Symptoms of acute and chronic sinusitis are the same. The primary symptoms are pain and pressure around the affected sinuses. Other symptoms include:  Upper toothache.  Earache.  Headache.  Bad breath.  Decreased sense of smell and taste.  A cough, which worsens when you are lying flat.  Fatigue.  Fever.  Thick drainage from your nose, which often is green and may contain pus (purulent).  Swelling and warmth over the affected sinuses. DIAGNOSIS Your health care provider will perform a physical exam. During your exam, your health care provider may perform any of the following to help determine if you have acute sinusitis or chronic sinusitis:  Look in your nose for signs of abnormal growths in your nostrils (nasal polyps).  Tap over the affected sinus to check for signs of  infection.  View the inside of your sinuses using an imaging device that has a light attached (endoscope). If your health care provider suspects that you have chronic sinusitis, one or more of the following tests may be recommended:  Allergy tests.  Nasal culture. A sample of mucus is taken from your nose, sent to a lab, and screened for bacteria.  Nasal cytology. A sample of mucus is taken from your nose and examined by your health care provider to determine if your sinusitis is related to an allergy. TREATMENT Most cases of acute sinusitis are related to a viral infection and will resolve on their own within 10 days. Sometimes, medicines are prescribed to help relieve symptoms of both acute and chronic sinusitis. These may include pain medicines, decongestants, nasal steroid sprays, or saline sprays. However, for sinusitis related to a bacterial infection, your health care provider will prescribe antibiotic medicines. These are medicines that will help kill the bacteria causing the infection. Rarely, sinusitis is caused by a fungal infection. In these cases, your health care provider will prescribe antifungal medicine. For some cases of chronic sinusitis, surgery is needed. Generally, these are cases in which sinusitis recurs more than 3 times per year, despite other treatments. HOME CARE INSTRUCTIONS  Drink plenty of water. Water helps thin the mucus so your sinuses can drain more easily.  Use a humidifier.  Inhale steam 3-4 times a day (for example, sit in the bathroom with the shower running).  Apply a warm, moist washcloth to your face 3-4 times a day, or as directed by your health care provider.  Use saline nasal sprays to help   moisten and clean your sinuses.  Take medicines only as directed by your health care provider.  If you were prescribed either an antibiotic or antifungal medicine, finish it all even if you start to feel better. SEEK IMMEDIATE MEDICAL CARE IF:  You have  increasing pain or severe headaches.  You have nausea, vomiting, or drowsiness.  You have swelling around your face.  You have vision problems.  You have a stiff neck.  You have difficulty breathing.   This information is not intended to replace advice given to you by your health care provider. Make sure you discuss any questions you have with your health care provider.   Document Released: 09/12/2005 Document Revised: 10/03/2014 Document Reviewed: 09/27/2011 Elsevier Interactive Patient Education 2016 [employer]  - Take meds as prescribed - Use a cool mist humidifier  -Use saline nose sprays frequently -Saline irrigations of the nose can be very helpful if done frequently.  * 4X daily for 1 week*  * Use of a nettie pot can be helpful with this. Follow directions with this* -Force fluids -For any cough or congestion  Use plain Mucinex- regular strength or max strength is fine   * Children- consult with Pharmacist for dosing -For fever or aces or pains- take tylenol or ibuprofen appropriate for age and weight.  * for fevers greater than 101 orally you may alternate ibuprofen and tylenol every  3 hours. -Throat lozenges if help   Christy Hawks, FNP   

## 2015-09-07 NOTE — Progress Notes (Signed)
Subjective:    Patient ID: Anna Richardson, female    DOB: 10/26/1975, 39 y.o.   MRN: [id number]  Sinusitis This is a new problem. The current episode started in the past 7 days. The problem has been gradually worsening since onset. There has been no fever. Her pain is at a severity of 6/10. The pain is moderate. Associated symptoms include chills, congestion, coughing, ear pain, headaches, sinus pressure and sneezing. Pertinent negatives include no hoarse voice, shortness of breath or sore throat. Past treatments include oral decongestants, spray decongestants and acetaminophen. The treatment provided mild relief.      Review of Systems  Constitutional: Positive for chills.  HENT: Positive for congestion, ear pain, sinus pressure and sneezing. Negative for hoarse voice and sore throat.   Eyes: Negative.   Respiratory: Positive for cough. Negative for shortness of breath.   Cardiovascular: Negative.  Negative for palpitations.  Gastrointestinal: Negative.   Endocrine: Negative.   Genitourinary: Negative.   Musculoskeletal: Negative.   Neurological: Positive for headaches.  Hematological: Negative.   Psychiatric/Behavioral: Negative.   All other systems reviewed and are negative.      Objective:   Physical Exam  Constitutional: She is oriented to person, place, and time. She appears well-developed and well-nourished. No distress.  HENT:  Head: Normocephalic and atraumatic.  Right Ear: External ear normal.  Nose: Right sinus exhibits maxillary sinus tenderness and frontal sinus tenderness. Left sinus exhibits maxillary sinus tenderness and frontal sinus tenderness.  Nasal passage erythemas with mild swelling  Oropharynx erythemas   Eyes: Pupils are equal, round, and reactive to light.  Neck: Normal range of motion. Neck supple. No thyromegaly present.  Cardiovascular: Normal rate, regular rhythm, normal heart sounds and intact distal pulses.   No murmur  heard. Pulmonary/Chest: Effort normal and breath sounds normal. No respiratory distress. She has no wheezes.  Abdominal: Soft. Bowel sounds are normal. She exhibits no distension. There is no tenderness.  Musculoskeletal: Normal range of motion. She exhibits no edema or tenderness.  Neurological: She is alert and oriented to person, place, and time. She has normal reflexes. No cranial nerve deficit.  Skin: Skin is warm and dry.  Psychiatric: She has a normal mood and affect. Her behavior is normal. Judgment and thought content normal.  Vitals reviewed.     BP 111/71 mmHg  Pulse 73  Temp(Src) 98.6 F (37 C) (Oral)  Ht 5\' 4"  (1.626 m)  Wt 188 lb (85.276 kg)  BMI 32.25 kg/m2     Assessment & Plan:  1. Acute frontal sinusitis, recurrence not specified -- Take meds as prescribed - Use a cool mist humidifier  -Use saline nose sprays frequently -Saline irrigations of the nose can be very helpful if done frequently.  * 4X daily for 1 week*  * Use of a nettie pot can be helpful with this. Follow directions with this* -Force fluids -For any cough or congestion  Use plain Mucinex- regular strength or max strength is fine   * Children- consult with Pharmacist for dosing -For fever or aces or pains- take tylenol or ibuprofen appropriate for age and weight.  * for fevers greater than 101 orally you may alternate ibuprofen and tylenol every  3 hours. -Throat lozenges if help - amoxicillin-clavulanate (AUGMENTIN) 875-125 MG tablet; Take 1 tablet by mouth 2 (two) times daily.  Dispense: 14 tablet; Refill: 0 - mometasone (NASONEX) 50 MCG/ACT nasal spray; Place 2 sprays into the nose daily.  Dispense: 17 g; Refill: 12  Evelina Dun, FNP

## 2015-11-11 ENCOUNTER — Other Ambulatory Visit: Payer: Self-pay | Admitting: Obstetrics and Gynecology

## 2015-11-12 LAB — CYTOLOGY - PAP

## 2015-11-18 ENCOUNTER — Ambulatory Visit: Payer: BLUE CROSS/BLUE SHIELD | Admitting: Pediatrics

## 2015-12-08 ENCOUNTER — Encounter: Payer: Self-pay | Admitting: Pediatrics

## 2015-12-08 ENCOUNTER — Ambulatory Visit (INDEPENDENT_AMBULATORY_CARE_PROVIDER_SITE_OTHER): Payer: BLUE CROSS/BLUE SHIELD | Admitting: Pediatrics

## 2015-12-08 ENCOUNTER — Encounter (INDEPENDENT_AMBULATORY_CARE_PROVIDER_SITE_OTHER): Payer: Self-pay

## 2015-12-08 VITALS — BP 103/68 | HR 83 | Temp 98.6°F | Ht 64.0 in | Wt 190.4 lb

## 2015-12-08 DIAGNOSIS — R6889 Other general symptoms and signs: Secondary | ICD-10-CM

## 2015-12-08 LAB — VERITOR FLU A/B WAIVED
INFLUENZA B: NEGATIVE
Influenza A: NEGATIVE

## 2015-12-08 NOTE — Progress Notes (Signed)
    Subjective:    Patient ID: Anna Richardson, female    DOB: 13-Aug-1976, 40 y.o.   MRN: [id number]  CC: Nasal Congestion; Chills; Fatigue; and Sore Throat   HPI: Anna Richardson is a 40 y.o. 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 Interim medical history since our last visit reviewed. Allergies and medications reviewed and updated.    ROS: Per HPI unless specifically indicated above  History  Smoking status  . Current Every Day Smoker -- 0.50 packs/day  . Types: Cigarettes  Smokeless tobacco  . Never Used    Past Medical History Patient Active Problem List   Diagnosis Date Noted  . Generalized anxiety disorder 08/15/2015  . Tobacco abuse 08/15/2015  . BMI 31.0-31.9,adult 08/15/2015  . Left lower quadrant pain 08/15/2015  . Hirsutism 08/15/2015      Objective:    BP 103/68 mmHg  Pulse 83  Temp(Src) 98.6 F (37 C) (Oral)  Ht 5\' 4"  (1.626 m)  Wt 190 lb 6.4 oz (86.365 kg)  BMI 32.67 kg/m2  Wt Readings from Last 3 Encounters:  12/08/15 190 lb 6.4 oz (86.365 kg)  09/07/15 188 lb (85.276 kg)  08/12/15 186 lb (84.369 kg)     Gen: NAD, alert, cooperative with exam, NCAT, congested EYES: EOMI, no scleral injection or icterus ENT:  TMs pearly gray b/l, OP with mild erythema LYMPH: no cervical LAD CV: NRRR, normal S1/S2, no murmur, distal pulses 2+ b/l Resp: CTABL, no wheezes, normal WOB Abd: +BS, soft, NTND. no guarding or organomegaly Ext: No edema, warm Neuro: Alert and oriented, strength equal b/l UE and LE,  coordination grossly normal MSK: normal muscle bulk     Assessment & Plan:    Anna Richardson was seen today for nasal congestion, chills, fatigue and sore throat. Flu negative, discussed symptomatic care. Given flu exposure, imperfect rapid test and symptoms offered tamiflu, pt declined.  Diagnoses and all orders for this visit:  Flu-like symptoms -     Veritor Flu A/B Waived    Follow up plan: Prn   Rex Krasarol Vincent, MD [facility] 12/08/2015, 8:31 AM

## 2015-12-08 NOTE — Patient Instructions (Signed)
Flonase two sprays each side twice a day Salt sprays or rinses for sinus congestion (neti pot or similar) Ibuprofen 600mg  three times a day

## 2016-01-26 ENCOUNTER — Ambulatory Visit (INDEPENDENT_AMBULATORY_CARE_PROVIDER_SITE_OTHER): Payer: BLUE CROSS/BLUE SHIELD | Admitting: Family Medicine

## 2016-01-26 ENCOUNTER — Encounter: Payer: Self-pay | Admitting: Family Medicine

## 2016-01-26 VITALS — BP 114/77 | HR 87 | Temp 98.6°F | Ht 64.0 in | Wt 187.0 lb

## 2016-01-26 DIAGNOSIS — J101 Influenza due to other identified influenza virus with other respiratory manifestations: Secondary | ICD-10-CM | POA: Diagnosis not present

## 2016-01-26 DIAGNOSIS — J0100 Acute maxillary sinusitis, unspecified: Secondary | ICD-10-CM | POA: Diagnosis not present

## 2016-01-26 LAB — VERITOR FLU A/B WAIVED
INFLUENZA B: POSITIVE — AB
Influenza A: NEGATIVE

## 2016-01-26 MED ORDER — AMOXICILLIN-POT CLAVULANATE 875-125 MG PO TABS: 1.0000 | ORAL_TABLET | Freq: Two times a day (BID) | ORAL | Status: DC

## 2016-01-26 NOTE — Patient Instructions (Signed)
Great to meet you!  If your facial pain is not improving by Friday or if it becomes very severe please start the Augmentin.   Get plenty of fluid, rest, and use tylenol or ibuprofen every 4-6 hours as needed  Influenza, Adult Influenza ("the flu") is a viral infection of the respiratory tract. It occurs more often in winter months because people spend more time in close contact with one another. Influenza can make you feel very sick. Influenza easily spreads from person to person (contagious). CAUSES  Influenza is caused by a virus that infects the respiratory tract. You can catch the virus by breathing in droplets from an infected person's cough or sneeze. You can also catch the virus by touching something that was recently contaminated with the virus and then touching your mouth, nose, or eyes. RISKS AND COMPLICATIONS You may be at risk for a more severe case of influenza if you smoke cigarettes, have diabetes, have chronic heart disease (such as heart failure) or lung disease (such as asthma), or if you have a weakened immune system. Elderly people and pregnant women are also at risk for more serious infections. The most common problem of influenza is a lung infection (pneumonia). Sometimes, this problem can require emergency medical care and may be life threatening. SIGNS AND SYMPTOMS  Symptoms typically last 4 to 10 days and may include:  Fever.  Chills.  Headache, body aches, and muscle aches.  Sore throat.  Chest discomfort and cough.  Poor appetite.  Weakness or feeling tired.  Dizziness.  Nausea or vomiting. DIAGNOSIS  Diagnosis of influenza is often made based on your history and a physical exam. A nose or throat swab test can be done to confirm the diagnosis. TREATMENT  In mild cases, influenza goes away on its own. Treatment is directed at relieving symptoms. For more severe cases, your health care provider may prescribe antiviral medicines to shorten the sickness.  Antibiotic medicines are not effective because the infection is caused by a virus, not by bacteria. HOME CARE INSTRUCTIONS  Take medicines only as directed by your health care provider.  Use a cool mist humidifier to make breathing easier.  Get plenty of rest until your temperature returns to normal. This usually takes 3 to 4 days.  Drink enough fluid to keep your urine clear or pale yellow.  Cover yourmouth and nosewhen coughing or sneezing,and wash your handswellto prevent thevirusfrom spreading.  Stay homefromwork orschool untilthe fever is gonefor at least 251full day. PREVENTION  An annual influenza vaccination (flu shot) is the best way to avoid getting influenza. An annual flu shot is now routinely recommended for all adults in the [place] SEEK MEDICAL CARE IF:  You experiencechest pain, yourcough worsens,or you producemore mucus.  Youhave nausea,vomiting, ordiarrhea.  Your fever returns or gets worse. SEEK IMMEDIATE MEDICAL CARE IF:  You havetrouble breathing, you become short of breath,or your skin ornails becomebluish.  You have severe painor stiffnessin the neck.  You develop a sudden headache, or pain in the face or ear.  You have nausea or vomiting that you cannot control. MAKE SURE YOU:   Understand these instructions.  Will watch your condition.  Will get help right away if you are not doing well or get worse.   This information is not intended to replace advice given to you by your health care provider. Make sure you discuss any questions you have with your health care provider.   Document Released: 09/09/2000 Document Revised: 10/03/2014 Document Reviewed: 12/12/2011  Chartered certified accountant Patient Education [employer].

## 2016-01-26 NOTE — Progress Notes (Signed)
   HPI  Patient presents today for acute visit.  Patient states she's had facial pain, cough, nasal congestion and chest tightness, and bodyaches for the past 4 days. She seems to be steadily worsening. She had a fever measured at 1021 day ago. She has severe malaise and feels like just lying in bed. She is tolerating fluids normally.  She denies shortness of breath and chest pain apart from cough.  She had a similar illness about 6-8 weeks ago that resolved without any interventions other than symptomatic treatment.  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  PMH: Smoking status noted ROS: Per HPI  Objective: BP 114/77 mmHg  Pulse 87  Temp(Src) 98.6 F (37 C) (Oral)  Ht 5\' 4"  (1.626 m)  Wt 187 lb (84.823 kg)  BMI 32.08 kg/m2  LMP 01/12/2016 Gen: NAD, alert, cooperative with exam HEENT: NCAT, bilateral maxillary sinus tenderness to palpation, left greater than right, TMs normal bilaterally, oropharynx clear and moist CV: RRR, good S1/S2, no murmur Resp: CTABL, no wheezes, non-labored Ext: No edema, warm Neuro: Alert and oriented, No gross deficits  Assessment and plan:  # influenza Flu B positive Likely developing sinusitis, augmentin printed and discussed reasons to start Supportive care discussed Works at a school, 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     Orders Placed This Encounter  Procedures  . Veritor Flu A/B Waived    Order Specific Question:  Source    Answer:  nose    Meds ordered this encounter  Medications  . amoxicillin-clavulanate (AUGMENTIN) 875-125 MG tablet    Sig: Take 1 tablet by mouth 2 (two) times daily.    Dispense:  20 tablet    Refill:  0    Murtis SinkSam Akshat Minehart, MD [facility] 01/26/2016, 8:37 AM

## 2016-08-05 ENCOUNTER — Encounter: Payer: BLUE CROSS/BLUE SHIELD | Admitting: Pediatrics

## 2016-08-08 ENCOUNTER — Telehealth: Payer: Self-pay | Admitting: Pediatrics

## 2016-08-08 ENCOUNTER — Encounter: Payer: Self-pay | Admitting: Pediatrics

## 2016-08-10 ENCOUNTER — Encounter: Payer: BLUE CROSS/BLUE SHIELD | Admitting: Family Medicine

## 2016-08-10 ENCOUNTER — Encounter: Payer: Self-pay | Admitting: Family Medicine

## 2016-08-10 ENCOUNTER — Ambulatory Visit (INDEPENDENT_AMBULATORY_CARE_PROVIDER_SITE_OTHER): Payer: BLUE CROSS/BLUE SHIELD | Admitting: Family Medicine

## 2016-08-10 DIAGNOSIS — Z0000 Encounter for general adult medical examination without abnormal findings: Secondary | ICD-10-CM | POA: Insufficient documentation

## 2016-08-10 DIAGNOSIS — Z111 Encounter for screening for respiratory tuberculosis: Secondary | ICD-10-CM | POA: Diagnosis not present

## 2016-08-10 NOTE — Progress Notes (Signed)
   Subjective:    Patient ID: Anna CurlKimberly A Denker, female    DOB: Jun 08, 1976, 40 y.o.   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 Last had a physical about one year ago. She has no complaints today we completed her questionnaire asking about pulmonary heart disease and emotional or mental issues and everything was negative. TB test will be applied.  Patient Active Problem List   Diagnosis Date Noted  . Generalized anxiety disorder 08/15/2015  . Tobacco abuse 08/15/2015  . BMI 31.0-31.9,adult 08/15/2015  . Left lower quadrant pain 08/15/2015  . Hirsutism 08/15/2015   Outpatient Encounter Prescriptions as of 08/10/2016  Medication Sig  . citalopram (CELEXA) 20 MG tablet   . [facility] 1/35 1-35 MG-MCG tablet   . [DISCONTINUED] amoxicillin-clavulanate (AUGMENTIN) 875-125 MG tablet Take 1 tablet by mouth 2 (two) times daily.   No facility-administered encounter medications on file as of 08/10/2016.       Review of Systems  Constitutional: Negative.   HENT: Negative.   Eyes: Negative.   Respiratory: Negative.   Cardiovascular: Negative.   Gastrointestinal: Negative.   Endocrine: Negative.   Genitourinary: Negative.   Hematological: Negative.   Psychiatric/Behavioral: Negative.        Objective:   Physical Exam  Constitutional: She is oriented to person, place, and time. She appears well-developed and well-nourished.  HENT:  Mouth/Throat: Oropharynx is clear and moist.  Eyes: Pupils are equal, round, and reactive to light.  Neck: Normal range of motion.  Cardiovascular: Normal rate, regular rhythm, normal heart sounds and intact distal pulses.   Pulmonary/Chest: Effort normal.  Abdominal: Soft. There is no tenderness.  Musculoskeletal: Normal range of motion.  Neurological: She is alert and oriented to person, place, and time.  Skin: Skin is warm. No rash noted.  Psychiatric: She has a normal mood and affect. Her behavior is  normal.   BP 129/83   Pulse 84   Temp 97.1 F (36.2 C) (Oral)   Ht 5\' 4"  (1.626 m)   Wt 198 lb (89.8 kg)   LMP 08/05/2016   BMI 33.99 kg/m         Assessment & Plan:  1. Annual physical exam Exam completed no issues identified except for moderately overweight   Frederica KusterStephen M Miller MD .

## 2016-08-11 ENCOUNTER — Ambulatory Visit (INDEPENDENT_AMBULATORY_CARE_PROVIDER_SITE_OTHER): Payer: BLUE CROSS/BLUE SHIELD | Admitting: *Deleted

## 2016-08-11 DIAGNOSIS — Z0000 Encounter for general adult medical examination without abnormal findings: Secondary | ICD-10-CM

## 2016-08-11 LAB — TB SKIN TEST
Induration: 0 mm
TB Skin Test: NEGATIVE

## 2016-08-11 NOTE — Progress Notes (Signed)
PPD negative

## 2016-11-01 ENCOUNTER — Encounter: Payer: Self-pay | Admitting: Family Medicine

## 2016-11-01 ENCOUNTER — Ambulatory Visit (INDEPENDENT_AMBULATORY_CARE_PROVIDER_SITE_OTHER): Payer: BLUE CROSS/BLUE SHIELD | Admitting: Family Medicine

## 2016-11-01 ENCOUNTER — Encounter: Payer: Self-pay | Admitting: *Deleted

## 2016-11-01 VITALS — BP 105/72 | HR 77 | Temp 98.0°F | Ht 64.0 in | Wt 203.0 lb

## 2016-11-01 DIAGNOSIS — B349 Viral infection, unspecified: Secondary | ICD-10-CM

## 2016-11-01 MED ORDER — OSELTAMIVIR PHOSPHATE 75 MG PO CAPS: 75.0000 mg | ORAL_CAPSULE | Freq: Two times a day (BID) | ORAL | 0 refills | Status: DC

## 2016-11-01 NOTE — Progress Notes (Signed)
   Subjective:    Patient ID: Anna Richardson, female    DOB: 1976-04-19, 41 y.o.   MRN: [id number]  HPI Patient here today for flu like symptoms that started yesterday.     Patient Active Problem List   Diagnosis Date Noted  . Annual physical exam 08/10/2016  . Generalized anxiety disorder 08/15/2015  . Tobacco abuse 08/15/2015  . BMI 31.0-31.9,adult 08/15/2015  . Left lower quadrant pain 08/15/2015  . Hirsutism 08/15/2015   Outpatient Encounter Prescriptions as of 11/01/2016  Medication Sig  . citalopram (CELEXA) 20 MG tablet   . [facility] 1/[facility] 1-35 MG-MCG tablet    No facility-administered encounter medications on file as of 11/01/2016.      Review of Systems  Constitutional: Positive for chills and fatigue.  HENT: Positive for congestion.   Eyes: Negative.   Respiratory: Positive for cough.   Cardiovascular: Negative.   Gastrointestinal: Negative.   Endocrine: Negative.   Genitourinary: Negative.   Musculoskeletal: Positive for myalgias.  Skin: Negative.   Allergic/Immunologic: Negative.   Neurological: Positive for headaches.  Hematological: Negative.   Psychiatric/Behavioral: Negative.        Objective:   Physical Exam  Constitutional: She is oriented to person, place, and time. She appears well-developed and well-nourished.  HENT:  Mouth/Throat: Oropharynx is clear and moist.  Cardiovascular: Normal rate, regular rhythm and normal heart sounds.   Pulmonary/Chest: Effort normal and breath sounds normal.  Neurological: She is alert and oriented to person, place, and time.   BP 105/72 (BP Location: Left Arm)   Pulse 77   Temp 98 F (36.7 C) (Oral)   Ht 5\' 4"  (1.626 m)   Wt 203 lb (92.1 kg)   LMP 10/11/2016   BMI 34.84 kg/m         Assessment & Plan:   1. Viral syndrome Patient probably has flu. She does not want to do flu test but is willing to take Tamiflu. Also recommend rest fluids Tylenol or ibuprofen for myalgias. Declined need for cough  suppressant  Frederica KusterStephen M Miller MD

## 2016-11-03 ENCOUNTER — Encounter: Payer: Self-pay | Admitting: *Deleted

## 2016-11-03 ENCOUNTER — Encounter: Payer: Self-pay | Admitting: Family Medicine

## 2016-12-12 ENCOUNTER — Other Ambulatory Visit: Payer: Self-pay | Admitting: Obstetrics and Gynecology

## 2016-12-14 LAB — CYTOLOGY - PAP

## 2016-12-27 ENCOUNTER — Encounter: Payer: Self-pay | Admitting: Family Medicine

## 2016-12-29 ENCOUNTER — Ambulatory Visit: Payer: Self-pay | Admitting: Family Medicine

## 2016-12-30 ENCOUNTER — Encounter: Payer: Self-pay | Admitting: Pediatrics

## 2017-01-24 ENCOUNTER — Ambulatory Visit (INDEPENDENT_AMBULATORY_CARE_PROVIDER_SITE_OTHER): Payer: BLUE CROSS/BLUE SHIELD | Admitting: Nurse Practitioner

## 2017-01-24 ENCOUNTER — Encounter: Payer: Self-pay | Admitting: Nurse Practitioner

## 2017-01-24 VITALS — BP 126/86 | HR 66 | Temp 100.0°F | Ht 64.0 in | Wt 203.0 lb

## 2017-01-24 DIAGNOSIS — J0100 Acute maxillary sinusitis, unspecified: Secondary | ICD-10-CM

## 2017-01-24 MED ORDER — AMOXICILLIN-POT CLAVULANATE 875-125 MG PO TABS: 1.0000 | ORAL_TABLET | Freq: Two times a day (BID) | ORAL | 0 refills | Status: DC

## 2017-01-24 NOTE — Progress Notes (Signed)
   Subjective:    Patient ID: Anna Richardson, female    DOB: [birth year], 41 y.o.   MRN: [id number]  HPI  Patient comes in today c/o fever,headache with congestion- started yesterday. Very sleepy.    Review of Systems  Constitutional: Positive for chills and fever (100.3 oral). Negative for appetite change.  HENT: Positive for congestion, ear pain, rhinorrhea, sinus pain and sinus pressure. Negative for sore throat and trouble swallowing.   Respiratory: Negative for cough.   Cardiovascular: Negative.   Gastrointestinal: Negative.   Genitourinary: Negative.   Neurological: Negative.   Psychiatric/Behavioral: Negative.   All other systems reviewed and are negative.      Objective:   Physical Exam  Constitutional: She is oriented to person, place, and time. She appears well-developed and well-nourished. No distress.  HENT:  Right Ear: Hearing, tympanic membrane, external ear and ear canal normal.  Left Ear: Hearing, tympanic membrane, external ear and ear canal normal.  Nose: Mucosal edema and rhinorrhea present. Right sinus exhibits maxillary sinus tenderness. Left sinus exhibits maxillary sinus tenderness.  Mouth/Throat: Uvula is midline, oropharynx is clear and moist and mucous membranes are normal.  Neck: Normal range of motion. Neck supple.  Cardiovascular: Normal rate and regular rhythm.   Pulmonary/Chest: Effort normal and breath sounds normal.  Lymphadenopathy:    She has no cervical adenopathy.  Neurological: She is alert and oriented to person, place, and time.  Skin: Skin is warm.  Psychiatric: She has a normal mood and affect. Her behavior is normal. Thought content normal.   BP 126/86   Pulse 66   Temp 100 F (37.8 C) (Oral)   Ht  (1.626 m)   Wt 203 lb (92.1 kg)   BMI 34.84 kg/m         Assessment & Plan:   1. Acute non-recurrent maxillary sinusitis    1. Take meds as prescribed 2. Use a cool mist humidifier especially during the winter months  and when heat has been humid. 3. Use saline nose sprays frequently 4. Saline irrigations of the nose can be very helpful if done frequently.  * 4X daily for 1 week*  * Use of a nettie pot can be helpful with this. Follow directions with this* 5. Drink plenty of fluids 6. Keep thermostat turn down low 7.For any cough or congestion  Use plain Mucinex- regular strength or max strength is fine   * Children- consult with Pharmacist for dosing 8. For fever or aces or pains- take tylenol or ibuprofen appropriate for age and weight.  * for fevers greater than 101 orally you may alternate ibuprofen and tylenol every  3 hours.   Meds ordered this encounter  Medications  . amoxicillin-clavulanate (AUGMENTIN) 875-125 MG tablet    Sig: Take 1 tablet by mouth 2 (two) times daily.    Dispense:  20 tablet    Refill:  0    Order Specific Question:   Supervising Provider    Answer:   Johna Sheriff [[id number]]   Mary-Margaret Daphine Deutscher, FNP

## 2017-01-24 NOTE — Patient Instructions (Signed)

## 2017-01-27 LAB — BASIC METABOLIC PANEL: Glucose: 82 mg/dL

## 2017-05-13 ENCOUNTER — Encounter: Payer: Self-pay | Admitting: Family Medicine

## 2017-05-13 ENCOUNTER — Ambulatory Visit (INDEPENDENT_AMBULATORY_CARE_PROVIDER_SITE_OTHER): Payer: Commercial Managed Care - PPO | Admitting: Family Medicine

## 2017-05-13 VITALS — BP 108/75 | HR 83 | Temp 98.1°F | Ht 64.0 in | Wt 208.0 lb

## 2017-05-13 DIAGNOSIS — R5383 Other fatigue: Secondary | ICD-10-CM

## 2017-05-13 DIAGNOSIS — M542 Cervicalgia: Secondary | ICD-10-CM

## 2017-05-13 NOTE — Progress Notes (Signed)
BP 108/75   Pulse 83   Temp 98.1 F (36.7 C) (Oral)   Ht _0  (1.626 m)   Wt 208 lb (94.3 kg)   BMI 35.70 kg/m    Subjective:    Patient ID: Anna Richardson, female    DOB: 06/16/1976, 41 y.o.   MRN: [id number]  HPI: Anna Richardson is a 41 y.o. female presenting on 05/13/2017 for Fatigue, pain on left front side of neck, hair loss (x 2 days, fatigue has been going on for a while and has worsened)   HPI Fatigue and decreased energy and hair change and neck pain Patient comes in complaining of fatigue and left-sided neck pain and decreased energy and hair loss. The subcutaneous symptoms besides the neck pain have been going on for a few months and she is concerned about thyroid disease as it does run in her family. The neck pain just started 2 days ago and is only on the left side of her neck extending from just below her jaw line down to that upper side of her neck. She denies any swelling or lumps or erythema or warmth. She denies any cough congestion sinus pressure or runny nose. She does not know if she has ear pain or not because of words that. She denies any dental problems and does see a dentist every 6 months and has invisiline in. She denies any recent tick bites or fevers or chills. She denies any abdominal complaints or urinary complaints or bowel complaints. She denies any menstrual cycle complaints.  Relevant past medical, surgical, family and social history reviewed and updated as indicated. Interim medical history since our last visit reviewed. Allergies and medications reviewed and updated.  Review of Systems  Constitutional: Positive for fatigue. Negative for chills and fever.  HENT: Negative for congestion, ear discharge, ear pain, mouth sores, postnasal drip, rhinorrhea, sinus pain, sinus pressure and sneezing.   Eyes: Negative for redness and visual disturbance.  Respiratory: Negative for cough, chest tightness and shortness of breath.   Cardiovascular: Negative  for chest pain and leg swelling.  Gastrointestinal: Negative for abdominal pain.  Endocrine: Positive for cold intolerance. Negative for heat intolerance.  Genitourinary: Negative for difficulty urinating and dysuria.  Musculoskeletal: Positive for neck pain. Negative for back pain, gait problem, myalgias and neck stiffness.  Skin: Negative for rash.  Neurological: Negative for light-headedness and headaches.  Psychiatric/Behavioral: Negative for agitation and behavioral problems.  All other systems reviewed and are negative.   Per HPI unless specifically indicated above   Allergies as of 05/13/2017      Reactions   Ciprofloxacin    EYE DROPS Throat swelling      Medication List       Accurate as of 05/13/17  8:50 AM. Always use your most recent med list.          citalopram 20 MG tablet Commonly known as:  CELEXA   KELNOR 1/35 1-35 MG-MCG tablet Generic drug:  ethynodiol-ethinyl estradiol          Objective:    BP 108/75   Pulse 83   Temp 98.1 F (36.7 C) (Oral)   Ht _1  (1.626 m)   Wt 208 lb (94.3 kg)   BMI 35.70 kg/m   Wt Readings from Last 3 Encounters:  05/13/17 208 lb (94.3 kg)  01/24/17 203 lb (92.1 kg)  11/01/16 203 lb (92.1 kg)    Physical Exam  Constitutional: She is oriented to person, place, and  time. She appears well-developed and well-nourished. No distress.  Eyes: Conjunctivae are normal.  Neck: Neck supple. No thyromegaly present.  Cardiovascular: Normal rate, regular rhythm, normal heart sounds and intact distal pulses.   No murmur heard. Pulmonary/Chest: Effort normal and breath sounds normal. No respiratory distress. She has no wheezes. She has no rales.  Abdominal: Soft. Bowel sounds are normal. She exhibits no distension. There is no tenderness. There is no rebound.  Musculoskeletal: Normal range of motion. She exhibits no edema.  Lymphadenopathy:    She has no cervical adenopathy.  Neurological: She is alert and oriented to  person, place, and time. Coordination normal.  Skin: Skin is warm and dry. No rash noted. She is not diaphoretic.  Psychiatric: She has a normal mood and affect. Her behavior is normal.  Nursing note and vitals reviewed.     Assessment & Plan:   Problem List Items Addressed This Visit    None    Visit Diagnoses    Fatigue, unspecified type    -  Primary   Relevant Orders   CBC with Differential/Platelet   CMP14+EGFR   TSH   Neck pain on left side       Could be related to an palpable lymphadenopathy or TMJ, does not fit classic for either of these, recommended Flonase and allergy medicine, return if worsens       Follow up plan: Return if symptoms worsen or fail to improve.  Counseling provided for all of the vaccine components Orders Placed This Encounter  Procedures  . CBC with Differential/Platelet  . CMP14+EGFR  . TSH    Anna Pina, MD [facility] 05/13/2017, 8:50 AM

## 2017-05-14 LAB — CMP14+EGFR
ALBUMIN: 4.1 g/dL (ref 3.5–5.5)
ALT: 14 IU/L (ref 0–32)
AST: 17 IU/L (ref 0–40)
Albumin/Globulin Ratio: 1.6 (ref 1.2–2.2)
Alkaline Phosphatase: 88 IU/L (ref 39–117)
BUN / CREAT RATIO: 20 (ref 9–23)
BUN: 16 mg/dL (ref 6–24)
Bilirubin Total: 0.3 mg/dL (ref 0.0–1.2)
CALCIUM: 8.9 mg/dL (ref 8.7–10.2)
CO2: 22 mmol/L (ref 20–29)
CREATININE: 0.81 mg/dL (ref 0.57–1.00)
Chloride: 104 mmol/L (ref 96–106)
GFR, EST AFRICAN AMERICAN: 105 mL/min/{1.73_m2} (ref >59)
GFR, EST NON AFRICAN AMERICAN: 91 mL/min/{1.73_m2} (ref >59)
GLOBULIN, TOTAL: 2.5 g/dL (ref 1.5–4.5)
Glucose: 86 mg/dL (ref 65–99)
Potassium: 4.6 mmol/L (ref 3.5–5.2)
SODIUM: 138 mmol/L (ref 134–144)
Total Protein: 6.6 g/dL (ref 6.0–8.5)

## 2017-05-14 LAB — CBC WITH DIFFERENTIAL/PLATELET
BASOS: 1 %
Basophils Absolute: 0 10*3/uL (ref 0.0–0.2)
EOS (ABSOLUTE): 0.2 10*3/uL (ref 0.0–0.4)
EOS: 4 %
HEMATOCRIT: 41.2 % (ref 34.0–46.6)
HEMOGLOBIN: 13.4 g/dL (ref 11.1–15.9)
IMMATURE GRANULOCYTES: 0 %
Immature Grans (Abs): 0 10*3/uL (ref 0.0–0.1)
LYMPHS ABS: 1.6 10*3/uL (ref 0.7–3.1)
Lymphs: 34 %
MCH: 31.5 pg (ref 26.6–33.0)
MCHC: 32.5 g/dL (ref 31.5–35.7)
MCV: 97 fL (ref 79–97)
MONOCYTES: 5 %
Monocytes Absolute: 0.3 10*3/uL (ref 0.1–0.9)
NEUTROS PCT: 56 %
Neutrophils Absolute: 2.6 10*3/uL (ref 1.4–7.0)
Platelets: 335 10*3/uL (ref 150–379)
RBC: 4.26 x10E6/uL (ref 3.77–5.28)
RDW: 13.3 % (ref 12.3–15.4)
WBC: 4.7 10*3/uL (ref 3.4–10.8)

## 2017-05-14 LAB — TSH: TSH: 1.58 u[IU]/mL (ref 0.450–4.500)

## 2017-10-16 ENCOUNTER — Other Ambulatory Visit: Payer: Self-pay | Admitting: *Deleted

## 2017-10-16 ENCOUNTER — Ambulatory Visit (INDEPENDENT_AMBULATORY_CARE_PROVIDER_SITE_OTHER): Payer: Commercial Managed Care - PPO | Admitting: Pediatrics

## 2017-10-16 ENCOUNTER — Encounter: Payer: Self-pay | Admitting: Pediatrics

## 2017-10-16 VITALS — BP 117/77 | HR 66 | Temp 98.6°F | Ht 64.0 in | Wt 208.2 lb

## 2017-10-16 DIAGNOSIS — Z23 Encounter for immunization: Secondary | ICD-10-CM | POA: Diagnosis not present

## 2017-10-16 DIAGNOSIS — R6889 Other general symptoms and signs: Secondary | ICD-10-CM | POA: Diagnosis not present

## 2017-10-16 DIAGNOSIS — F411 Generalized anxiety disorder: Secondary | ICD-10-CM

## 2017-10-16 DIAGNOSIS — Z0000 Encounter for general adult medical examination without abnormal findings: Secondary | ICD-10-CM

## 2017-10-16 DIAGNOSIS — Z6835 Body mass index (BMI) 35.0-35.9, adult: Secondary | ICD-10-CM

## 2017-10-16 LAB — VERITOR FLU A/B WAIVED
Influenza A: NEGATIVE
Influenza B: NEGATIVE

## 2017-10-16 MED ORDER — CITALOPRAM HYDROBROMIDE 20 MG PO TABS: 20.0000 mg | ORAL_TABLET | Freq: Every day | ORAL | 3 refills | Status: DC

## 2017-10-16 NOTE — Progress Notes (Signed)
  Subjective:   Patient ID: Anna Richardson, female    DOB: 24-Mar-1976, 42 y.o.   MRN: [id number] CC: Annual Exam (form); Influenza; Fatigue (x 3 days); Generalized Body Aches; Nausea; and Chills  HPI: Anna Richardson is a 42 y.o. female presenting for Annual Exam (form); Influenza; Fatigue (x 3 days); Generalized Body Aches; Nausea; and Chills  Started having fever, chills, muscle aches three days ago Works at [employer] Wants to know if she has the flu Appetite has been down Some runny nose, some coughing, some sore throat Drinking plenty of fluids  Periods regular Takes OCP regularly  Anxiety: takes citalopram regularly, symptoms well controlled now Mood has been fine  Follows with gynecology for pap smear and mammogram, UTD now  Relevant past medical, surgical, family and social history reviewed. Allergies and medications reviewed and updated. Social History   Tobacco Use  Smoking Status Current Every Day Smoker  . Packs/day: 0.50  . Types: Cigarettes  Smokeless Tobacco Never Used   ROS: all systems negative other than what is in HPI  Objective:    BP 117/77   Pulse 66   Temp 98.6 F (37 C) (Oral)   Ht 5\' 4"  (1.626 m)   Wt 208 lb 3.2 oz (94.4 kg)   BMI 35.74 kg/m   Wt Readings from Last 3 Encounters:  10/16/17 208 lb 3.2 oz (94.4 kg)  05/13/17 208 lb (94.3 kg)  01/24/17 203 lb (92.1 kg)    Gen: NAD, alert, cooperative with exam, NCAT, congested EYES: EOMI, no conjunctival injection, or no icterus ENT:  TMs pearly gray b/l, OP without erythema LYMPH: no cervical LAD CV: NRRR, normal S1/S2, no murmur, distal pulses 2+ b/l Resp: CTABL, no wheezes, normal WOB Abd: +BS, soft, NTND. no guarding or organomegaly Ext: No edema, warm Neuro: Alert and oriented, strength equal b/l UE and LE, coordination grossly normal, stutters with some words MSK: normal muscle bulk Skin: several small brown macules on upper back that appear similar, 1-133mm, one on slightly  raised 1mm skin tag  Assessment & Plan:  Anna Richardson was seen today for annual exam, influenza, fatigue, generalized body aches, nausea and chills.  Diagnoses and all orders for this visit:  Encounter for preventive health examination BMI 35 Discussed lifestyle changes, increase fruits, vegetables, physical activity  Need for tuberculosis vaccination -     PPD  Flu-like symptoms Acute URI Flu neg Symptoms likely from acute URI, discussed ysmptom care, return precautions -     Veritor Flu A/B Waived  Generalized anxiety disorder Stable on citalopram, cont  Follow up plan: 1 yr, sooner if needed Rex Krasarol Anis Cinelli, MD [facility]

## 2017-10-18 LAB — TB SKIN TEST
Induration: 0 mm
TB Skin Test: NEGATIVE

## 2017-10-18 NOTE — Addendum Note (Signed)
Addended by: Johna SheriffVINCENT, CAROL L on: 10/18/2017 03:21 PM   Modules accepted: Kipp BroodSmartSet

## 2017-12-22 ENCOUNTER — Encounter: Payer: Self-pay | Admitting: Pediatrics

## 2017-12-23 MED ORDER — KELNOR 1/35 1-35 MG-MCG PO TABS: 1.0000 | ORAL_TABLET | Freq: Every day | ORAL | 1 refills | Status: DC

## 2018-02-10 ENCOUNTER — Other Ambulatory Visit: Payer: Self-pay | Admitting: Pediatrics

## 2018-05-21 ENCOUNTER — Ambulatory Visit: Payer: Commercial Managed Care - PPO

## 2018-05-30 ENCOUNTER — Ambulatory Visit (INDEPENDENT_AMBULATORY_CARE_PROVIDER_SITE_OTHER): Payer: BC Managed Care – PPO | Admitting: *Deleted

## 2018-05-30 DIAGNOSIS — Z23 Encounter for immunization: Secondary | ICD-10-CM | POA: Diagnosis not present

## 2018-05-30 DIAGNOSIS — Z029 Encounter for administrative examinations, unspecified: Secondary | ICD-10-CM

## 2018-05-30 NOTE — Patient Instructions (Signed)
Tdap Vaccine (Tetanus, Diphtheria and Pertussis): What You Need to Know 1. Why get vaccinated? Tetanus, diphtheria and pertussis are very serious diseases. Tdap vaccine can protect us from these diseases. And, Tdap vaccine given to pregnant women can protect newborn babies against pertussis. TETANUS (Lockjaw) is rare in the [place] today. It causes painful muscle tightening and stiffness, usually all over the body.  It can lead to tightening of muscles in the head and neck so you can't open your mouth, swallow, or sometimes even breathe. Tetanus kills about 1 out of 10 people who are infected even after receiving the best medical care.  DIPHTHERIA is also rare in the [place] today. It can cause a thick coating to form in the back of the throat.  It can lead to breathing problems, heart failure, paralysis, and death.  PERTUSSIS (Whooping Cough) causes severe coughing spells, which can cause difficulty breathing, vomiting and disturbed sleep.  It can also lead to weight loss, incontinence, and rib fractures. Up to 2 in 100 adolescents and 5 in 100 adults with pertussis are hospitalized or have complications, which could include pneumonia or death.  These diseases are caused by bacteria. Diphtheria and pertussis are spread from person to person through secretions from coughing or sneezing. Tetanus enters the body through cuts, scratches, or wounds. Before vaccines, as many as 200,000 cases of diphtheria, 200,000 cases of pertussis, and hundreds of cases of tetanus, were reported in the [place] each year. Since vaccination began, reports of cases for tetanus and diphtheria have dropped by about 99% and for pertussis by about 80%. 2. Tdap vaccine Tdap vaccine can protect adolescents and adults from tetanus, diphtheria, and pertussis. One dose of Tdap is routinely given at age 11 or 12. People who did not get Tdap at that age should get it as soon as possible. Tdap is especially  important for healthcare professionals and anyone having close contact with a baby younger than 12 months. Pregnant women should get a dose of Tdap during every pregnancy, to protect the newborn from pertussis. Infants are most at risk for severe, life-threatening complications from pertussis. Another vaccine, called Td, protects against tetanus and diphtheria, but not pertussis. A Td booster should be given every 10 years. Tdap may be given as one of these boosters if you have never gotten Tdap before. Tdap may also be given after a severe cut or burn to prevent tetanus infection. Your doctor or the person giving you the vaccine can give you more information. Tdap may safely be given at the same time as other vaccines. 3. Some people should not get this vaccine  A person who has ever had a life-threatening allergic reaction after a previous dose of any diphtheria, tetanus or pertussis containing vaccine, OR has a severe allergy to any part of this vaccine, should not get Tdap vaccine. Tell the person giving the vaccine about any severe allergies.  Anyone who had coma or long repeated seizures within 7 days after a childhood dose of DTP or DTaP, or a previous dose of Tdap, should not get Tdap, unless a cause other than the vaccine was found. They can still get Td.  Talk to your doctor if you: ? have seizures or another nervous system problem, ? had severe pain or swelling after any vaccine containing diphtheria, tetanus or pertussis, ? ever had a condition called Guillain-Barr Syndrome (GBS), ? aren't feeling well on the day the shot is scheduled. 4. Risks With any medicine, including   vaccines, there is a chance of side effects. These are usually mild and go away on their own. Serious reactions are also possible but are rare. Most people who get Tdap vaccine do not have any problems with it. Mild problems following Tdap: (Did not interfere with activities)  Pain where the shot was given (about  3 in 4 adolescents or 2 in 3 adults)  Redness or swelling where the shot was given (about 1 person in 5)  Mild fever of at least 100.4F (up to about 1 in 25 adolescents or 1 in 100 adults)  Headache (about 3 or 4 people in 10)  Tiredness (about 1 person in 3 or 4)  Nausea, vomiting, diarrhea, stomach ache (up to 1 in 4 adolescents or 1 in 10 adults)  Chills, sore joints (about 1 person in 10)  Body aches (about 1 person in 3 or 4)  Rash, swollen glands (uncommon)  Moderate problems following Tdap: (Interfered with activities, but did not require medical attention)  Pain where the shot was given (up to 1 in 5 or 6)  Redness or swelling where the shot was given (up to about 1 in 16 adolescents or 1 in 12 adults)  Fever over 102F (about 1 in 100 adolescents or 1 in 250 adults)  Headache (about 1 in 7 adolescents or 1 in 10 adults)  Nausea, vomiting, diarrhea, stomach ache (up to 1 or 3 people in 100)  Swelling of the entire arm where the shot was given (up to about 1 in 500).  Severe problems following Tdap: (Unable to perform usual activities; required medical attention)  Swelling, severe pain, bleeding and redness in the arm where the shot was given (rare).  Problems that could happen after any vaccine:  People sometimes faint after a medical procedure, including vaccination. Sitting or lying down for about 15 minutes can help prevent fainting, and injuries caused by a fall. Tell your doctor if you feel dizzy, or have vision changes or ringing in the ears.  Some people get severe pain in the shoulder and have difficulty moving the arm where a shot was given. This happens very rarely.  Any medication can cause a severe allergic reaction. Such reactions from a vaccine are very rare, estimated at fewer than 1 in a million doses, and would happen within a few minutes to a few hours after the vaccination. As with any medicine, there is a very remote chance of a vaccine  causing a serious injury or death. The safety of vaccines is always being monitored. For more information, visit: www.cdc.gov/vaccinesafety/ 5. What if there is a serious problem? What should I look for? Look for anything that concerns you, such as signs of a severe allergic reaction, very high fever, or unusual behavior. Signs of a severe allergic reaction can include hives, swelling of the face and throat, difficulty breathing, a fast heartbeat, dizziness, and weakness. These would usually start a few minutes to a few hours after the vaccination. What should I do?  If you think it is a severe allergic reaction or other emergency that can't wait, call 9-1-1 or get the person to the nearest hospital. Otherwise, call your doctor.  Afterward, the reaction should be reported to the Vaccine Adverse Event Reporting System (VAERS). Your doctor might file this report, or you can do it yourself through the VAERS web site at www.vaers.hhs.gov, or by calling 1-800-822-7967. ? VAERS does not give medical advice. 6. The National Vaccine Injury Compensation Program The National   Vaccine Injury Compensation Program (VICP) is a federal program that was created to compensate people who may have been injured by certain vaccines. Persons who believe they may have been injured by a vaccine can learn about the program and about filing a claim by calling 1-800-338-2382 or visiting the VICP website at www.hrsa.gov/vaccinecompensation. There is a time limit to file a claim for compensation. 7. How can I learn more?  Ask your doctor. He or she can give you the vaccine package insert or suggest other sources of information.  Call your local or state health department.  Contact the Centers for Disease Control and Prevention (CDC): ? Call 1-800-232-4636 (1-800-CDC-INFO) or ? Visit CDC's website at www.cdc.gov/vaccines CDC Tdap Vaccine VIS (11/19/13) This information is not intended to replace advice given to you by your  health care provider. Make sure you discuss any questions you have with your health care provider. Document Released: 03/13/2012 Document Revised: 06/02/2016 Document Reviewed: 06/02/2016 Elsevier Interactive Patient Education  2017 Elsevier Inc.  

## 2018-05-30 NOTE — Progress Notes (Signed)
Pt given Tdap vaccine Tolerated well 

## 2018-06-07 ENCOUNTER — Ambulatory Visit: Payer: BC Managed Care – PPO | Admitting: *Deleted

## 2018-06-07 NOTE — Progress Notes (Signed)
Erroneous encounter Pt did not receive any vaccines

## 2018-06-08 ENCOUNTER — Other Ambulatory Visit: Payer: Self-pay | Admitting: Pediatrics

## 2018-06-08 NOTE — Telephone Encounter (Signed)
Last PAP 12/12/16  Dr Oswaldo DoneVincent

## 2018-08-06 ENCOUNTER — Other Ambulatory Visit: Payer: Self-pay | Admitting: Family Medicine

## 2018-08-06 MED ORDER — KELNOR 1/35 1-35 MG-MCG PO TABS: 1.0000 | ORAL_TABLET | Freq: Every day | ORAL | 0 refills | Status: DC

## 2018-09-04 ENCOUNTER — Ambulatory Visit: Payer: BC Managed Care – PPO | Admitting: Family Medicine

## 2018-09-04 ENCOUNTER — Encounter: Payer: Self-pay | Admitting: Family Medicine

## 2018-09-04 ENCOUNTER — Ambulatory Visit (INDEPENDENT_AMBULATORY_CARE_PROVIDER_SITE_OTHER): Payer: BC Managed Care – PPO

## 2018-09-04 VITALS — BP 130/85 | HR 67 | Temp 98.5°F | Ht 64.0 in | Wt 216.2 lb

## 2018-09-04 DIAGNOSIS — M25531 Pain in right wrist: Secondary | ICD-10-CM | POA: Diagnosis not present

## 2018-09-04 NOTE — Progress Notes (Signed)
BP 130/85   Pulse 67   Temp 98.5 F (36.9 C) (Oral)   Ht 5\' 4"  (1.626 m)   Wt 216 lb 3.2 oz (98.1 kg)   BMI 37.11 kg/m    Subjective:    Patient ID: Anna Richardson, female    DOB: 10/12/1975, 42 y.o.   MRN: [id number]  HPI: Anna Richardson is a 42 y.o. female presenting on 09/04/2018 for Joint Swelling (right wrist. yesterday she felt it pop after turning it)   HPI Right wrist pain and swelling Patient has noticed over the past couple weeks that she has been having some troubles with her right wrist popping every now and then and it feels like it is out of place but then will come back.  She says it hurts a lot more with turning her hand into supination and not as much in the pronation.  She denies any redness or warmth but does have swelling in that wrist and extending into her hand as well.  Relevant past medical, surgical, family and social history reviewed and updated as indicated. Interim medical history since our last visit reviewed. Allergies and medications reviewed and updated.  Review of Systems  Constitutional: Negative for chills and fever.  Eyes: Negative for visual disturbance.  Respiratory: Negative for chest tightness and shortness of breath.   Cardiovascular: Negative for chest pain and leg swelling.  Musculoskeletal: Positive for arthralgias and joint swelling. Negative for back pain and gait problem.  Skin: Negative for rash.  Neurological: Negative for light-headedness and headaches.  Psychiatric/Behavioral: Negative for agitation and behavioral problems.  All other systems reviewed and are negative.   Per HPI unless specifically indicated above   Allergies as of 09/04/2018      Reactions   Ciprofloxacin    EYE DROPS Throat swelling      Medication List        Accurate as of 09/04/18 11:30 AM. Always use your most recent med list.          citalopram 20 MG tablet Commonly known as:  CELEXA TAKE 1 TABLET BY MOUTH EVERY DAY   KELNOR  1/35 1-35 MG-MCG tablet Generic drug:  ethynodiol-ethinyl estradiol Take 1 tablet by mouth daily.          Objective:    BP 130/85   Pulse 67   Temp 98.5 F (36.9 C) (Oral)   Ht 5\' 4"  (1.626 m)   Wt 216 lb 3.2 oz (98.1 kg)   BMI 37.11 kg/m   Wt Readings from Last 3 Encounters:  09/04/18 216 lb 3.2 oz (98.1 kg)  10/16/17 208 lb 3.2 oz (94.4 kg)  05/13/17 208 lb (94.3 kg)    Physical Exam  Constitutional: She is oriented to person, place, and time. She appears well-developed and well-nourished. No distress.  Eyes: Conjunctivae are normal.  Musculoskeletal:       Right wrist: She exhibits decreased range of motion (Unable to fully supinate the wrist), tenderness, bony tenderness and swelling. She exhibits no effusion and no crepitus.  Neurological: She is alert and oriented to person, place, and time. Coordination normal.  Skin: Skin is warm and dry. No rash noted. She is not diaphoretic.  Psychiatric: She has a normal mood and affect. Her behavior is normal.  Nursing note and vitals reviewed.   Right wrist x-ray: Read by radiology as no dislocation or fracture, will do consult with orthopedic     Assessment & Plan:   Problem List Items Addressed  This Visit    None    Visit Diagnoses    Right wrist pain    -  Primary   Relevant Orders   DG Wrist Complete Right (Completed)   Ambulatory referral to Orthopedic Surgery      Will do consult with orthopedic hand specialist, not read his dislocation but has a lot of pain in that region and cannot fully supinate because of the pain.  Recommended anti-inflammatories and icing and will get to a hand specialist Follow up plan: Return if symptoms worsen or fail to improve.  Counseling provided for all of the vaccine components Orders Placed This Encounter  Procedures  . DG Wrist Complete Right  . Ambulatory referral to Orthopedic Surgery    Arville CareJoshua Banjamin Stovall, MD [facility] 09/04/2018, 11:30  AM

## 2018-09-05 ENCOUNTER — Encounter: Payer: Self-pay | Admitting: Family Medicine

## 2018-10-23 ENCOUNTER — Other Ambulatory Visit: Payer: Self-pay | Admitting: Family Medicine

## 2018-11-02 ENCOUNTER — Other Ambulatory Visit: Payer: Self-pay | Admitting: Pediatrics

## 2018-11-12 ENCOUNTER — Other Ambulatory Visit: Payer: Self-pay | Admitting: Family Medicine

## 2018-11-13 NOTE — Telephone Encounter (Signed)
Prior Anna Richardson. NTBS 30 days given 10/23/18

## 2018-11-13 NOTE — Telephone Encounter (Signed)
Left message to call back to schedule appointment for medication refill.

## 2018-12-05 ENCOUNTER — Other Ambulatory Visit: Payer: Self-pay

## 2018-12-05 ENCOUNTER — Encounter: Payer: Self-pay | Admitting: Family

## 2018-12-05 ENCOUNTER — Ambulatory Visit: Payer: BC Managed Care – PPO | Admitting: Family

## 2018-12-05 VITALS — BP 115/76 | HR 79 | Temp 98.9°F | Ht 64.0 in | Wt 214.6 lb

## 2018-12-05 DIAGNOSIS — R6889 Other general symptoms and signs: Secondary | ICD-10-CM

## 2018-12-05 DIAGNOSIS — J209 Acute bronchitis, unspecified: Secondary | ICD-10-CM | POA: Diagnosis not present

## 2018-12-05 DIAGNOSIS — Z720 Tobacco use: Secondary | ICD-10-CM

## 2018-12-05 LAB — VERITOR FLU A/B WAIVED
INFLUENZA A: NEGATIVE
Influenza B: NEGATIVE

## 2018-12-05 MED ORDER — BENZONATATE 200 MG PO CAPS: 200.0000 mg | ORAL_CAPSULE | Freq: Three times a day (TID) | ORAL | 1 refills | Status: DC | PRN

## 2018-12-05 MED ORDER — PREDNISONE 10 MG (21) PO TBPK: ORAL_TABLET | ORAL | 0 refills | Status: DC

## 2018-12-05 NOTE — Patient Instructions (Signed)

## 2018-12-05 NOTE — Progress Notes (Signed)
Subjective:    Patient ID: Anna Richardson, female    DOB: 1976/07/14, 43 y.o.   MRN: [id number]  Chief Complaint  Patient presents with  . Headache    began monday  . Cough  . fatique    Headache   Associated symptoms include coughing and rhinorrhea. Pertinent negatives include no ear pain, fever or sore throat.  Cough  This is a new problem. The current episode started in the past 7 days. The problem has been gradually worsening. The problem occurs every few minutes. The cough is non-productive. Associated symptoms include chills (sweats), headaches, myalgias, nasal congestion, postnasal drip, rhinorrhea and shortness of breath ("Kind of"). Pertinent negatives include no ear congestion, ear pain, fever, sore throat or wheezing. The symptoms are aggravated by lying down. Risk factors: quit a month ago. She has tried rest (tylenol ) for the symptoms. The treatment provided mild relief.      Review of Systems  Constitutional: Positive for chills (sweats). Negative for fever.  HENT: Positive for postnasal drip and rhinorrhea. Negative for ear pain and sore throat.   Respiratory: Positive for cough and shortness of breath ("Kind of"). Negative for wheezing.   Musculoskeletal: Positive for myalgias.  Neurological: Positive for headaches.  All other systems reviewed and are negative.      Objective:   Physical Exam Vitals signs reviewed.  Constitutional:      General: She is not in acute distress.    Appearance: She is well-developed. She is ill-appearing.  HENT:     Head: Normocephalic and atraumatic.     Right Ear: External ear normal.     Nose: Mucosal edema and rhinorrhea present.     Mouth/Throat:     Pharynx: Posterior oropharyngeal erythema present.  Eyes:     Pupils: Pupils are equal, round, and reactive to light.  Neck:     Musculoskeletal: Normal range of motion and neck supple.     Thyroid: No thyromegaly.  Cardiovascular:     Rate and Rhythm: Normal rate and  regular rhythm.     Heart sounds: Normal heart sounds. No murmur.  Pulmonary:     Effort: Pulmonary effort is normal. No respiratory distress.     Breath sounds: Normal breath sounds. No wheezing.  Chest:     Comments: Intermittent nonproductive cough Abdominal:     General: Bowel sounds are normal. There is no distension.     Palpations: Abdomen is soft.     Tenderness: There is no abdominal tenderness.  Musculoskeletal: Normal range of motion.        General: No tenderness.  Skin:    General: Skin is warm and dry.  Neurological:     Mental Status: She is alert and oriented to person, place, and time.     Cranial Nerves: No cranial nerve deficit.     Deep Tendon Reflexes: Reflexes are normal and symmetric.  Psychiatric:        Behavior: Behavior normal.        Thought Content: Thought content normal.        Judgment: Judgment normal.       BP 115/76   Pulse 79   Temp 98.9 F (37.2 C) (Oral)   Ht 5\' 4"  (1.626 m)   Wt 214 lb 9.6 oz (97.3 kg)   BMI 36.84 kg/m      Assessment & Plan:  KINJAL SONNER comes in today with chief complaint of Headache (began monday); Cough; and fatique  Diagnosis and orders addressed:  1. Flu-like symptoms - Veritor Flu A/B Waived  2. Tobacco abuse  3. Acute bronchitis, unspecified organism - Take meds as prescribed - Use a cool mist humidifier  -Use saline nose sprays frequently -Force fluids -For any cough or congestion  Use plain Mucinex- regular strength or max strength is fine -For fever or aces or pains- take tylenol or ibuprofen. -Throat lozenges if help -RTO if symptoms worsen or do not improve  - predniSONE (STERAPRED UNI-PAK 21 TAB) 10 MG (21) TBPK tablet; Use as directed  Dispense: 21 tablet; Refill: 0 - benzonatate (TESSALON) 200 MG capsule; Take 1 capsule (200 mg total) by mouth 3 (three) times daily as needed.  Dispense: 30 capsule; Refill: 1   Jannifer Rodney, FNP

## 2018-12-06 ENCOUNTER — Other Ambulatory Visit: Payer: Self-pay | Admitting: Pediatrics

## 2018-12-29 ENCOUNTER — Other Ambulatory Visit: Payer: Self-pay | Admitting: Pediatrics

## 2019-07-23 ENCOUNTER — Telehealth: Payer: Self-pay | Admitting: Physician Assistant

## 2019-07-23 ENCOUNTER — Other Ambulatory Visit: Payer: Self-pay

## 2019-07-23 DIAGNOSIS — J069 Acute upper respiratory infection, unspecified: Secondary | ICD-10-CM

## 2019-07-23 DIAGNOSIS — Z20822 Contact with and (suspected) exposure to covid-19: Secondary | ICD-10-CM

## 2019-07-23 MED ORDER — PREDNISONE 50 MG PO TABS: 50.0000 mg | ORAL_TABLET | Freq: Every day | ORAL | 0 refills | Status: AC

## 2019-07-23 MED ORDER — ALBUTEROL SULFATE HFA 108 (90 BASE) MCG/ACT IN AERS: 2.0000 | INHALATION_SPRAY | Freq: Four times a day (QID) | RESPIRATORY_TRACT | 0 refills | Status: DC | PRN

## 2019-07-23 MED ORDER — BENZONATATE 100 MG PO CAPS: 100.0000 mg | ORAL_CAPSULE | Freq: Three times a day (TID) | ORAL | 0 refills | Status: AC

## 2019-07-23 NOTE — Progress Notes (Signed)
E-Visit for Corona Virus Screening   We are sorry you are not feeling well.  Here is how we plan to help!  Based on what you have shared with me, it looks like you may have a viral upper respiratory infection.  Upper respiratory infections are caused by a large number of viruses; Rhinovirus is the most common cause; however, our current symptoms could also be consistent with the coronavirus.     Many health care providers can now test patients at their office but not all are.  [facility] has multiple testing sites. For information on our COVID testing locations and hours go to [email or web address]  Please quarantine yourself while awaiting your test results.  We are enrolling you in our MyChart Home Monitoring for COVID19 . Daily you will receive a questionnaire within the MyChart website. Our COVID 19 response team will be monitoring your responses daily.   COVID-19 is a respiratory illness with symptoms that are similar to the flu. Symptoms are typically mild to moderate, but there have been cases of severe illness and death due to the virus. The following symptoms may appear 2-14 days after exposure: . Fever . Cough . Shortness of breath or difficulty breathing . Chills . Repeated shaking with chills . Muscle pain . Headache . Sore throat . New loss of taste or smell . Fatigue . Congestion or runny nose . Nausea or vomiting . Diarrhea  It is vitally important that if you feel that you have an infection such as this virus or any other virus that you stay home and away from places where you may spread it to others.  You should self-quarantine for 14 days if you have symptoms that could potentially be coronavirus or have been in close contact a with a person diagnosed with COVID-19 within the last 2 weeks. You should avoid contact with people age 37 and older.   You should wear a mask or cloth face covering over your nose and mouth if you must be around other  people or animals, including pets (even at home). Try to stay at least 6 feet away from other people. This will protect the people around you.  You can use medication such as A prescription cough medication called Tessalon Perles 100 mg. You may take 1-2 capsules every 8 hours as needed for cough and A prescription inhaler called Albuterol MDI 90 mcg /actuation 2 puffs every 4 hours as needed for shortness of breath, wheezing, cough. I have also prescribed a course of prednisone for your wheezing.   You may also take acetaminophen (Tylenol) as needed for fever.   Reduce your risk of any infection by using the same precautions used for avoiding the common cold or flu:  Marland Kitchen Wash your hands often with soap and warm water for at least 20 seconds.  If soap and water are not readily available, use an alcohol-based hand sanitizer with at least 60% alcohol.  . If coughing or sneezing, cover your mouth and nose by coughing or sneezing into the elbow areas of your shirt or coat, into a tissue or into your sleeve (not your hands). . Avoid shaking hands with others and consider head nods or verbal greetings only. . Avoid touching your eyes, nose, or mouth with unwashed hands.  . Avoid close contact with people who are sick. . Avoid places or events with large numbers of people in one location, like concerts or sporting events. . Carefully consider travel plans you have or are making. Marland Kitchen  If you are planning any travel outside or inside the [place], visit the CDC's Travelers' Health webpage for the latest health notices. . If you have some symptoms but not all symptoms, continue to monitor at home and seek medical attention if your symptoms worsen. . If you are having a medical emergency, call 911.  HOME CARE . Only take medications as instructed by your medical team. . Drink plenty of fluids and get plenty of rest. . A steam or ultrasonic humidifier can help if you have congestion.   GET HELP RIGHT AWAY IF YOU  HAVE EMERGENCY WARNING SIGNS** FOR COVID-19. If you or someone is showing any of these signs seek emergency medical care immediately. Call 911 or proceed to your closest emergency facility if: . You have persistent shortness of breath, difficulty breathing, pain with breathing . You develop worsening high fever. . Bluish lips or face . Persistent pain or pressure in the chest . New confusion . Inability to wake or stay awake . You cough up blood. . Your symptoms become more severe  **This list is not all possible symptoms. Contact your medical provider for any symptoms that are sever or concerning to you.   MAKE SURE YOU   Understand these instructions.  Will watch your condition.  Will get help right away if you are not doing well or get worse.  Your e-visit answers were reviewed by a board certified advanced clinical practitioner to complete your personal care plan.  Depending on the condition, your plan could have included both over the counter or prescription medications.  If there is a problem please reply once you have received a response from your provider.  Your safety is important to [place].  If you have drug allergies check your prescription carefully.    You can use MyChart to ask questions about today's visit, request a non-urgent call back, or ask for a work or school excuse for 24 hours related to this e-Visit. If it has been greater than 24 hours you will need to follow up with your provider, or enter a new e-Visit to address those concerns. You will get an e-mail in the next two days asking about your experience.  I hope that your e-visit has been valuable and will speed your recovery. Thank you for using e-visits.  Greater than 5 minutes, yet less than 10 minutes of time have been spend researching, coordinating, and implementing care for this patient today.

## 2019-07-25 LAB — NOVEL CORONAVIRUS, NAA: SARS-CoV-2, NAA: NOT DETECTED

## 2019-08-28 ENCOUNTER — Encounter: Payer: Self-pay | Admitting: Family Medicine

## 2019-09-10 ENCOUNTER — Ambulatory Visit: Payer: BC Managed Care – PPO | Admitting: Family Medicine

## 2019-09-10 ENCOUNTER — Other Ambulatory Visit: Payer: Self-pay

## 2019-09-10 ENCOUNTER — Encounter: Payer: Self-pay | Admitting: Family Medicine

## 2019-09-10 VITALS — BP 121/81 | HR 79 | Temp 98.4°F | Ht 64.0 in | Wt 218.4 lb

## 2019-09-10 DIAGNOSIS — W57XXXA Bitten or stung by nonvenomous insect and other nonvenomous arthropods, initial encounter: Secondary | ICD-10-CM | POA: Diagnosis not present

## 2019-09-10 DIAGNOSIS — S80861A Insect bite (nonvenomous), right lower leg, initial encounter: Secondary | ICD-10-CM

## 2019-09-10 MED ORDER — DOXYCYCLINE HYCLATE 100 MG PO TABS: 100.0000 mg | ORAL_TABLET | Freq: Two times a day (BID) | ORAL | 0 refills | Status: AC

## 2019-09-10 MED ORDER — FLUCONAZOLE 150 MG PO TABS: 150.0000 mg | ORAL_TABLET | Freq: Once | ORAL | 0 refills | Status: AC

## 2019-09-10 NOTE — Patient Instructions (Signed)
Lyme Disease Lyme disease is an infection that can affect many parts of the body, including the skin, joints, and nervous system. It is a bacterial infection that starts from the bite of an infected tick. Over time, the infection can worsen, and some of the symptoms are similar to the flu. If Lyme disease is not treated, it may cause joint pain, swelling, numbness, problems thinking, fatigue, muscle weakness, and other problems. What are the causes? This condition is caused by bacteria called Borrelia burgdorferi.  You can get Lyme disease by being bitten by an infected tick.  Only black-legged, or Ixodes, ticks that are infected with the bacteria can cause Lyme disease.  The tick must be attached to your skin for a certain period of time to pass along the infection. This is usually 36-48 hours.  Deer often carry infected ticks. What increases the risk? The following factors may make you more likely to develop this condition:  Living in or visiting these areas in the [place] ? [place]. ? The [place]. ? [place].  Spending time in wooded or grassy areas.  Being outdoors with exposed skin.  Camping, gardening, hiking, fishing, hunting, or working outdoors.  Failing to remove a tick from your skin. What are the signs or symptoms? Symptoms of this condition may include:  Chills and fever.  Headache.  Fatigue.  General achiness.  Muscle pain.  Joint pain, often in the knees.  A round, red rash that surrounds the center of the tick bite. The center of the rash may be blood colored or have tiny blisters.  Swollen lymph glands.  Stiff neck. How is this diagnosed? This condition is diagnosed based on:  Your symptoms and medical history.  A physical exam.  A blood test. How is this treated? The main treatment for this condition is antibiotic medicine, which is usually taken by mouth (orally).  The length of treatment depends on how soon after a  tick bite you begin taking the medicine. In some cases, treatment is necessary for several weeks.  If the infection is severe, antibiotics may need to be given through an IV that is inserted into one of your veins. Follow these instructions at home:  Take over-the-counter and prescription medicines only as told by your health care provider. Finish all antibiotic medicine, even when you start to feel better.  Ask your health care provider about taking a probiotic in between doses of your antibiotic to help avoid an upset stomach or diarrhea.  Check with your health care provider before supplementing your treatment. Many alternative therapies have not been proven and may be harmful to you.  Keep all follow-up visits as told by your health care provider. This is important. How is this prevented? You can become reinfected if you get another tick bite from an infected tick. Take these steps to help prevent an infection:  Cover your skin with light-colored clothing when you are outdoors in the spring and summer months.  Spray clothing and skin with bug spray. The spray should be 20-30% DEET. You can also treat clothing with permethrin, and let it dry before you wear it. Do not apply permethrin directly to your skin. Permethrin can also be used to treat camping gear and boots. Always read and follow the instructions that come with a bug spray or insecticide.  Avoid wooded, grassy, and shaded areas.  Remove yard litter, brush, trash, and plants that attract deer and rodents.  Check yourself for ticks when   you come indoors.  Wash clothing worn each day.  Shower after spending time outdoors.  Check your pets for ticks before they come inside.  If you find a tick attached to your skin: ? Remove it with tweezers. ? Clean your hands and the bite area with rubbing alcohol or soap and water. ? Dispose of the tick by putting it in rubbing alcohol, putting it in a sealed bag or container, or  flushing it down the toilet. ? You may choose to save the tick in a sealed container if you wish for it to be tested at a later time. Pregnant women should take special care to avoid tick bites because it is possible that the infection may be passed along to the fetus. Contact a health care provider if:  You have symptoms after treatment.  You have removed a tick and want to bring it to your health care provider for testing. Get help right away if:  You have an irregular heartbeat.  You have chest pain.  You have nerve pain.  Your face feels numb.  You develop the following: ? A stiff neck. ? A severe headache. ? Severe nausea and vomiting. ? Sensitivity to light. Summary  Lyme disease is an infection that can affect many parts of the body, including the skin, joints, and nervous system.  This condition is caused by bacteria called Borrelia burgdorferi.  You can get Lyme disease by being bitten by an infected tick.  The main treatment for this condition is antibiotic medicine. This information is not intended to replace advice given to you by your health care provider. Make sure you discuss any questions you have with your health care provider. Document Released: 12/19/2000 Document Revised: 01/04/2019 Document Reviewed: 11/29/2018 Elsevier Patient Education  2020 [employer] Inc.  

## 2019-09-10 NOTE — Progress Notes (Signed)
Assessment & Plan:  1. Tick bite of right lower leg, initial encounter - Treating for Lyme disease due to red ring around tick bite. No evidence of tick left behind. Tick brought in by patient appears to be intact and the entire thing.  - doxycycline (VIBRA-TABS) 100 MG tablet; Take 1 tablet (100 mg total) by mouth 2 (two) times daily for 14 days. 1 po bid  Dispense: 28 tablet; Refill: 0 - fluconazole (DIFLUCAN) 150 MG tablet; Take 1 tablet (150 mg total) by mouth once for 1 dose.  Dispense: 2 tablet; Refill: 0   Follow up plan: Return if symptoms worsen or fail to improve.  Deliah Boston, MSN, APRN, FNP-C [facility]  Subjective:   Patient ID: Anna Richardson, female    DOB: 1976/07/31, 43 y.o.   MRN: [id number]  HPI: Anna Richardson is a 43 y.o. female presenting on 09/10/2019 for Tick Removal (right thigh- Patient states she removed it yesterday and is not sure if she removed it all)  Patient was experiencing some pain in her right leg yesterday.  When she took her pants off she found a tick had bitten and secured to her right thigh.  She did remove it with tweezers but is not sure she got it all.  She did bring the tick with her today.  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  Denies any joint pains but has had a mild headache.   ROS: Negative unless specifically indicated above in HPI.   Relevant past medical history reviewed and updated as indicated.   Allergies and medications reviewed and updated.   Current Outpatient Medications:  .  albuterol (VENTOLIN HFA) 108 (90 Base) MCG/ACT inhaler, Inhale 2 puffs into the lungs every 6 (six) hours as needed for wheezing or shortness of breath., Disp: 8 g, Rfl: 0 .  citalopram (CELEXA) 20 MG tablet, TAKE 1 TABLET BY MOUTH EVERY DAY, Disp: 90 tablet, Rfl: 0 .  doxycycline (VIBRA-TABS) 100 MG tablet, Take 1 tablet (100 mg total) by mouth 2 (two) times daily for 14 days. 1 po bid, Disp: 28  tablet, Rfl: 0 .  fluconazole (DIFLUCAN) 150 MG tablet, Take 1 tablet (150 mg total) by mouth once for 1 dose., Disp: 2 tablet, Rfl: 0  Allergies  Allergen Reactions  . Ciprofloxacin     EYE DROPS Throat swelling    Objective:   BP 121/81   Pulse 79   Temp 98.4 F (36.9 C) (Temporal)   Ht 5\' 4"  (1.626 m)   Wt 218 lb 6.4 oz (99.1 kg)   LMP 08/16/2019 (Approximate)   SpO2 98%   BMI 37.49 kg/m    Physical Exam Vitals reviewed.  Constitutional:      General: She is not in acute distress.    Appearance: Normal appearance. She is not ill-appearing, toxic-appearing or diaphoretic.  HENT:     Head: Normocephalic and atraumatic.  Eyes:     General: No scleral icterus.       Right eye: No discharge.        Left eye: No discharge.     Conjunctiva/sclera: Conjunctivae normal.  Cardiovascular:     Rate and Rhythm: Normal rate.  Pulmonary:     Effort: Pulmonary effort is normal. No respiratory distress.  Musculoskeletal:        General: Normal range of motion.     Cervical back: Normal range of motion.  Skin:    General: Skin  is warm and dry.     Capillary Refill: Capillary refill takes less than 2 seconds.     Comments: Scab to right thigh with a well defined red ring around it.   Neurological:     General: No focal deficit present.     Mental Status: She is alert and oriented to person, place, and time. Mental status is at baseline.  Psychiatric:        Mood and Affect: Mood normal.        Behavior: Behavior normal.        Thought Content: Thought content normal.        Judgment: Judgment normal.

## 2019-12-01 ENCOUNTER — Ambulatory Visit: Payer: Self-pay | Attending: Internal Medicine

## 2019-12-01 DIAGNOSIS — Z23 Encounter for immunization: Secondary | ICD-10-CM | POA: Insufficient documentation

## 2019-12-01 NOTE — Progress Notes (Signed)
   Covid-19 Vaccination Clinic  Name:  AREYANA LEONI    MRN: [id number] DOB: May 04, 1976  12/01/2019  Ms. Aguilera was observed post Covid-19 immunization for 15 minutes without incident. She was provided with Vaccine Information Sheet and instruction to access the V-Safe system.   Ms. Kaser was instructed to call 911 with any severe reactions post vaccine: Marland Kitchen Difficulty breathing  . Swelling of face and throat  . A fast heartbeat  . A bad rash all over body  . Dizziness and weakness   Immunizations Administered    Name Date Dose VIS Date Route   Pfizer COVID-19 Vaccine 12/01/2019  2:15 PM 0.3 mL 09/06/2019 Intramuscular   Manufacturer: [employer], [employer]   Lot: [id number]   NDC: [address]-0

## 2019-12-22 ENCOUNTER — Ambulatory Visit: Payer: Self-pay | Attending: Internal Medicine

## 2019-12-22 DIAGNOSIS — Z23 Encounter for immunization: Secondary | ICD-10-CM

## 2019-12-22 NOTE — Progress Notes (Signed)
   Covid-19 Vaccination Clinic  Name:  ILISSA ROSNER    MRN: [id number] DOB: 1975-10-08  12/22/2019  Ms. Mihalko was observed post Covid-19 immunization for 15 minutes without incident. She was provided with Vaccine Information Sheet and instruction to access the V-Safe system.   Ms. Shiffman was instructed to call 911 with any severe reactions post vaccine: Marland Kitchen Difficulty breathing  . Swelling of face and throat  . A fast heartbeat  . A bad rash all over body  . Dizziness and weakness   Immunizations Administered    Name Date Dose VIS Date Route   Pfizer COVID-19 Vaccine 12/22/2019 12:51 PM 0.3 mL 09/06/2019 Intramuscular   Manufacturer: [employer], [employer]   Lot: [id number]   NDC: [address]-0

## 2020-02-04 ENCOUNTER — Ambulatory Visit (INDEPENDENT_AMBULATORY_CARE_PROVIDER_SITE_OTHER): Payer: BC Managed Care – PPO | Admitting: Family Medicine

## 2020-02-04 DIAGNOSIS — J020 Streptococcal pharyngitis: Secondary | ICD-10-CM

## 2020-02-04 MED ORDER — AMOXICILLIN-POT CLAVULANATE 875-125 MG PO TABS: 1.0000 | ORAL_TABLET | Freq: Two times a day (BID) | ORAL | 0 refills | Status: DC

## 2020-02-04 NOTE — Progress Notes (Signed)
    Subjective:    Patient ID: Anna Richardson. , female    DOB: 12/31/1975, 44 y.o.   MRN: [id number]   HPI: Anna Richardson is a 44 y.o. female presenting for sore throat. Onset  Two days ago. Also pain in ears. Glands on sides of neck are sore. Sleeping constantly. Denies fever. No cough. Kind of short of breath because her throat feels swollen and closed.    Depression screen [facility] 2/9 09/10/2019 12/05/2018 09/04/2018 10/16/2017 05/13/2017  Decreased Interest 0 0 0 0 0  Down, Depressed, Hopeless 0 0 0 0 0  PHQ - 2 Score 0 0 0 0 0     Relevant past medical, surgical, family and social history reviewed and updated as indicated.  Interim medical history since our last visit reviewed. Allergies and medications reviewed and updated.  ROS:  Review of Systems  Constitutional: Negative for activity change, appetite change, chills and fever.  HENT: Positive for congestion and postnasal drip. Negative for ear discharge, ear pain, hearing loss, nosebleeds, rhinorrhea, sinus pressure, sneezing and trouble swallowing.   Respiratory: Negative for chest tightness and shortness of breath.   Cardiovascular: Negative for chest pain and palpitations.  Skin: Negative for rash.     Social History   Tobacco Use  Smoking Status Current Every Day Smoker  . Packs/day: 0.50  . Types: Cigarettes  Smokeless Tobacco Never Used       Objective:     Wt Readings from Last 3 Encounters:  09/10/19 218 lb 6.4 oz (99.1 kg)  12/05/18 214 lb 9.6 oz (97.3 kg)  09/04/18 216 lb 3.2 oz (98.1 kg)     Exam deferred. Pt. Harboring due to COVID 19. Phone visit performed.   Assessment & Plan:   1. Strep pharyngitis     Meds ordered this encounter  Medications  . amoxicillin-clavulanate (AUGMENTIN) 875-125 MG tablet    Sig: Take 1 tablet by mouth 2 (two) times daily. Take all of this medication    Dispense:  20 tablet    Refill:  0    No orders of the defined types were placed in this  encounter.     Diagnoses and all orders for this visit:  Strep pharyngitis  Other orders -     amoxicillin-clavulanate (AUGMENTIN) 875-125 MG tablet; Take 1 tablet by mouth 2 (two) times daily. Take all of this medication    Virtual Visit via telephone Note  I discussed the limitations, risks, security and privacy concerns of performing an evaluation and management service by telephone and the availability of in person appointments. The patient was identified with two identifiers. Pt.expressed understanding and agreed to proceed. Pt. Is at home. Dr. Darlyn Read is in his office.  Follow Up Instructions:   I discussed the assessment and treatment plan with the patient. The patient was provided an opportunity to ask questions and all were answered. The patient agreed with the plan and demonstrated an understanding of the instructions.   The patient was advised to call back or seek an in-person evaluation if the symptoms worsen or if the condition fails to improve as anticipated.   Total minutes including chart review and phone contact time: 12   Follow up plan: Return if symptoms worsen or fail to improve.  Mechele Claude, MD [place] [facility]

## 2020-04-02 ENCOUNTER — Other Ambulatory Visit: Payer: Self-pay

## 2020-04-02 ENCOUNTER — Other Ambulatory Visit (HOSPITAL_COMMUNITY): Payer: Self-pay | Admitting: General Surgery

## 2020-04-02 ENCOUNTER — Ambulatory Visit (HOSPITAL_COMMUNITY)
Admission: RE | Admit: 2020-04-02 | Discharge: 2020-04-02 | Disposition: A | Discharge status: Home / Self Care | Payer: BC Managed Care – PPO | Source: Ambulatory Visit | Attending: General Surgery | Admitting: General Surgery

## 2020-04-02 DIAGNOSIS — K8010 Calculus of gallbladder with chronic cholecystitis without obstruction: Secondary | ICD-10-CM | POA: Diagnosis present

## 2020-05-12 ENCOUNTER — Ambulatory Visit: Payer: Self-pay | Admitting: General Surgery

## 2020-05-28 NOTE — Patient Instructions (Addendum)
DUE TO COVID-19 ONLY ONE VISITOR IS ALLOWED TO COME WITH YOU AND STAY IN THE WAITING ROOM ONLY DURING PRE OP AND PROCEDURE DAY OF SURGERY. THE 1 VISITOR  MAY VISIT WITH YOU AFTER SURGERY IN YOUR PRIVATE ROOM DURING VISITING HOURS ONLY!  YOU NEED TO HAVE A COVID 19 TEST ON_9/7______ @_3 :00 pm______, THIS TEST MUST BE DONE BEFORE SURGERY,  COVID TESTING SITE [address] [place] , IT IS ON THE RIGHT GOING OUT [address] APPROXIMATELY  2 MINUTES PAST [employer] ON THE RIGHT. ONCE YOUR COVID TEST IS COMPLETED,  PLEASE BEGIN THE QUARANTINE INSTRUCTIONS AS OUTLINED IN YOUR HANDOUT.                [id number]   Your procedure is scheduled on: 06/04/20   Report to [facility] Main  Entrance   Report to admitting at 8:30 AM     Call this number if you have problems the morning of surgery [phone]    BRUSH YOUR TEETH MORNING OF SURGERY AND RINSE YOUR MOUTH OUT, NO CHEWING GUM CANDY OR MINTS.   No food after midnight.    You may have clear liquid until 7:30 AM.    At 7:30 AM drink pre surgery drink  . Nothing by mouth after 7:30 AM.   Take these medicines the morning of surgery with A SIP OF WATER: Citalopram                                 You may not have any metal on your body including hair pins and              piercings  Do not wear jewelry, make-up, lotions, powders or perfumes, deodorant             Do not wear nail polish on your fingernails.  Do not shave  48 hours prior to surgery.            Do not bring valuables to the hospital. [facility] IS NOT             RESPONSIBLE   FOR VALUABLES.  Contacts, dentures or bridgework may not be worn into surgery.       Patients discharged the day of surgery will not be allowed to drive home.   IF YOU ARE HAVING SURGERY AND GOING HOME THE SAME DAY, YOU MUST HAVE AN ADULT TO DRIVE YOU HOME AND BE WITH YOU FOR 24 HOURS . YOU MAY GO HOME BY TAXI OR UBER OR ORTHERWISE, BUT AN ADULT  MUST ACCOMPANY YOU HOME AND STAY WITH YOU FOR 24 HOURS.  Name and phone number of your driver:  Special Instructions: N/A              Please read over the following fact sheets you were given: _____________________________________________________________________             [facility] - Preparing for Surgery  Before surgery, you can play an important role.   Because skin is not sterile, your skin needs to be as free of germs as possible .  You can reduce the number of germs on your skin by washing with CHG (chlorahexidine gluconate) soap before surgery.   CHG is an antiseptic cleaner which kills germs and bonds with the skin to continue killing germs even after washing. Please DO NOT use if you have an allergy to  CHG or antibacterial soaps.   If your skin becomes reddened/irritated stop using the CHG and inform your nurse when you arrive at Short Stay. Do not shave (including legs and underarms) for at least 48 hours prior to the first CHG shower.    Please follow these instructions carefully:  1.  Shower with CHG Soap the night before surgery and the  morning of Surgery.  2.  If you choose to wash your hair, wash your hair first as usual with your  normal  shampoo.  3.  After you shampoo, rinse your hair and body thoroughly to remove the  shampoo.                                        4.  Use CHG as you would any other liquid soap.  You can apply chg directly  to the skin and wash                       Gently with a scrungie or clean washcloth.  5.  Apply the CHG Soap to your body ONLY FROM THE NECK DOWN.   Do not use on face/ open                           Wound or open sores. Avoid contact with eyes, ears mouth and genitals (private parts).                       Wash face,  Genitals (private parts) with your normal soap.             6.  Wash thoroughly, paying special attention to the area where your surgery  will be performed.  7.  Thoroughly rinse your body with warm water from  the neck down.  8.  DO NOT shower/wash with your normal soap after using and rinsing off  the CHG Soap.             9.  Pat yourself dry with a clean towel.            10.  Wear clean pajamas.            11.  Place clean sheets on your bed the night of your first shower and do not  sleep with pets. Day of Surgery : Do not apply any lotions/deodorants the morning of surgery.  Please wear clean clothes to the hospital/surgery center.  FAILURE TO FOLLOW THESE INSTRUCTIONS MAY RESULT IN THE CANCELLATION OF YOUR SURGERY PATIENT SIGNATURE_________________________________  NURSE SIGNATURE__________________________________  ________________________________________________________________________

## 2020-05-29 ENCOUNTER — Encounter (HOSPITAL_COMMUNITY): Payer: Self-pay

## 2020-05-29 ENCOUNTER — Other Ambulatory Visit: Payer: Self-pay

## 2020-05-29 ENCOUNTER — Encounter (HOSPITAL_COMMUNITY)
Admission: RE | Admit: 2020-05-29 | Discharge: 2020-05-29 | Disposition: A | Discharge status: Home / Self Care | Payer: BC Managed Care – PPO | Source: Ambulatory Visit | Attending: General Surgery | Admitting: General Surgery

## 2020-05-29 DIAGNOSIS — Z01812 Encounter for preprocedural laboratory examination: Secondary | ICD-10-CM | POA: Diagnosis present

## 2020-05-29 HISTORY — DX: Family history of other specified conditions: Z84.89

## 2020-05-29 LAB — CBC WITH DIFFERENTIAL/PLATELET
Abs Immature Granulocytes: 0.02 10*3/uL (ref 0.00–0.07)
Basophils Absolute: 0.1 10*3/uL (ref 0.0–0.1)
Basophils Relative: 1 %
Eosinophils Absolute: 0.2 10*3/uL (ref 0.0–0.5)
Eosinophils Relative: 2 %
HCT: 39.3 % (ref 36.0–46.0)
Hemoglobin: 13 g/dL (ref 12.0–15.0)
Immature Granulocytes: 0 %
Lymphocytes Relative: 25 %
Lymphs Abs: 2 10*3/uL (ref 0.7–4.0)
MCH: 32 pg (ref 26.0–34.0)
MCHC: 33.1 g/dL (ref 30.0–36.0)
MCV: 96.8 fL (ref 80.0–100.0)
Monocytes Absolute: 0.5 10*3/uL (ref 0.1–1.0)
Monocytes Relative: 6 %
Neutro Abs: 5.4 10*3/uL (ref 1.7–7.7)
Neutrophils Relative %: 66 %
Platelets: 355 10*3/uL (ref 150–400)
RBC: 4.06 MIL/uL (ref 3.87–5.11)
RDW: 11.9 % (ref 11.5–15.5)
WBC: 8.1 10*3/uL (ref 4.0–10.5)
nRBC: 0 % (ref 0.0–0.2)

## 2020-05-29 LAB — COMPREHENSIVE METABOLIC PANEL
ALT: 19 U/L (ref 0–44)
AST: 21 U/L (ref 15–41)
Albumin: 3.9 g/dL (ref 3.5–5.0)
Alkaline Phosphatase: 77 U/L (ref 38–126)
Anion gap: 8 (ref 5–15)
BUN: 23 mg/dL — ABNORMAL HIGH (ref 6–20)
CO2: 24 mmol/L (ref 22–32)
Calcium: 8.4 mg/dL — ABNORMAL LOW (ref 8.9–10.3)
Chloride: 105 mmol/L (ref 98–111)
Creatinine, Ser: 0.82 mg/dL (ref 0.44–1.00)
GFR calc Af Amer: >60 mL/min (ref >60)
GFR calc non Af Amer: >60 mL/min (ref >60)
Glucose, Bld: 94 mg/dL (ref 70–99)
Potassium: 3.9 mmol/L (ref 3.5–5.1)
Sodium: 137 mmol/L (ref 135–145)
Total Bilirubin: 0.5 mg/dL (ref 0.3–1.2)
Total Protein: 7.1 g/dL (ref 6.5–8.1)

## 2020-05-29 NOTE — Progress Notes (Signed)
COVID Vaccine Completed:Yes Date COVID Vaccine completed:12/22/19 COVID vaccine manufacturer: Pfizer     PCP - [employer] Cardiologist -no   Chest x-ray - no EKG - no Stress Test - no ECHO - no Cardiac Cath - no  Sleep Study - no CPAP -   Fasting Blood Sugar - NA Checks Blood Sugar _____ times a day  Blood Thinner Instructions:NA Aspirin Instructions: Last Dose:  Anesthesia review:   Patient denies shortness of breath, fever, cough and chest pain at PAT appointment yes   Patient verbalized understanding of instructions that were given to them at the PAT appointment. Patient was also instructed that they will need to review over the PAT instructions again at home before surgery. Yes  Pt is in good shape . She can climb 3 flights of stairs and has no SOB doing house work or ADLs She does have anxiety

## 2020-05-30 ENCOUNTER — Other Ambulatory Visit (HOSPITAL_COMMUNITY): Payer: BC Managed Care – PPO

## 2020-06-02 ENCOUNTER — Other Ambulatory Visit (HOSPITAL_COMMUNITY)
Admission: RE | Admit: 2020-06-02 | Discharge: 2020-06-02 | Disposition: A | Discharge status: Home / Self Care | Payer: BC Managed Care – PPO | Source: Ambulatory Visit | Attending: General Surgery | Admitting: General Surgery

## 2020-06-02 DIAGNOSIS — Z01812 Encounter for preprocedural laboratory examination: Secondary | ICD-10-CM | POA: Diagnosis present

## 2020-06-02 DIAGNOSIS — Z20822 Contact with and (suspected) exposure to covid-19: Secondary | ICD-10-CM | POA: Diagnosis not present

## 2020-06-03 LAB — SARS CORONAVIRUS 2 (TAT 6-24 HRS): SARS Coronavirus 2: NEGATIVE

## 2020-06-04 ENCOUNTER — Ambulatory Visit (HOSPITAL_COMMUNITY): Payer: BC Managed Care – PPO | Admitting: Certified Registered"

## 2020-06-04 ENCOUNTER — Ambulatory Visit (HOSPITAL_COMMUNITY)
Admission: RE | Admit: 2020-06-04 | Discharge: 2020-06-04 | Disposition: A | Discharge status: Home / Self Care | Payer: BC Managed Care – PPO | Source: Ambulatory Visit | Attending: General Surgery | Admitting: General Surgery

## 2020-06-04 ENCOUNTER — Encounter (HOSPITAL_COMMUNITY)
Admission: RE | Disposition: A | Discharge status: Home / Self Care | Payer: Self-pay | Source: Ambulatory Visit | Attending: General Surgery

## 2020-06-04 ENCOUNTER — Encounter (HOSPITAL_COMMUNITY): Payer: Self-pay | Admitting: General Surgery

## 2020-06-04 DIAGNOSIS — K8010 Calculus of gallbladder with chronic cholecystitis without obstruction: Secondary | ICD-10-CM | POA: Insufficient documentation

## 2020-06-04 DIAGNOSIS — Z79899 Other long term (current) drug therapy: Secondary | ICD-10-CM | POA: Diagnosis not present

## 2020-06-04 DIAGNOSIS — E669 Obesity, unspecified: Secondary | ICD-10-CM | POA: Diagnosis not present

## 2020-06-04 DIAGNOSIS — F419 Anxiety disorder, unspecified: Secondary | ICD-10-CM | POA: Diagnosis not present

## 2020-06-04 DIAGNOSIS — K8020 Calculus of gallbladder without cholecystitis without obstruction: Secondary | ICD-10-CM | POA: Diagnosis present

## 2020-06-04 DIAGNOSIS — F17210 Nicotine dependence, cigarettes, uncomplicated: Secondary | ICD-10-CM | POA: Diagnosis not present

## 2020-06-04 DIAGNOSIS — Z6835 Body mass index (BMI) 35.0-35.9, adult: Secondary | ICD-10-CM | POA: Diagnosis not present

## 2020-06-04 DIAGNOSIS — Z881 Allergy status to other antibiotic agents status: Secondary | ICD-10-CM | POA: Insufficient documentation

## 2020-06-04 HISTORY — PX: CHOLECYSTECTOMY: SHX55

## 2020-06-04 LAB — PREGNANCY, URINE: Preg Test, Ur: NEGATIVE

## 2020-06-04 SURGERY — LAPAROSCOPIC CHOLECYSTECTOMY
Anesthesia: General

## 2020-06-04 MED ORDER — BUPIVACAINE-EPINEPHRINE 0.25% -1:200000 IJ SOLN
INTRAMUSCULAR | Status: DC | PRN
  Administered 2020-06-04: 30 mL

## 2020-06-04 MED ORDER — KETOROLAC TROMETHAMINE 15 MG/ML IJ SOLN
15.0000 mg | INTRAMUSCULAR | Status: AC
  Administered 2020-06-04: 15 mg via INTRAVENOUS
  Filled 2020-06-04: qty 1

## 2020-06-04 MED ORDER — FENTANYL CITRATE (PF) 250 MCG/5ML IJ SOLN
INTRAMUSCULAR | Status: DC | PRN
Start: 2020-06-04 — End: 2020-06-04
  Administered 2020-06-04 (×2): 50 ug via INTRAVENOUS
  Administered 2020-06-04: 100 ug via INTRAVENOUS

## 2020-06-04 MED ORDER — BUPIVACAINE-EPINEPHRINE (PF) 0.25% -1:200000 IJ SOLN
INTRAMUSCULAR | Status: AC
  Filled 2020-06-04: qty 30

## 2020-06-04 MED ORDER — FENTANYL CITRATE (PF) 100 MCG/2ML IJ SOLN
INTRAMUSCULAR | Status: AC
  Administered 2020-06-04: 50 ug via INTRAVENOUS
  Filled 2020-06-04: qty 2

## 2020-06-04 MED ORDER — ONDANSETRON HCL 4 MG/2ML IJ SOLN
INTRAMUSCULAR | Status: AC
  Filled 2020-06-04: qty 2

## 2020-06-04 MED ORDER — PROPOFOL 10 MG/ML IV BOLUS
INTRAVENOUS | Status: DC | PRN
  Administered 2020-06-04: 150 mg via INTRAVENOUS

## 2020-06-04 MED ORDER — ONDANSETRON HCL 4 MG/2ML IJ SOLN
INTRAMUSCULAR | Status: DC | PRN
  Administered 2020-06-04: 4 mg via INTRAVENOUS

## 2020-06-04 MED ORDER — LIDOCAINE 2% (20 MG/ML) 5 ML SYRINGE
INTRAMUSCULAR | Status: AC
  Filled 2020-06-04: qty 5

## 2020-06-04 MED ORDER — HYDROCODONE-ACETAMINOPHEN 5-325 MG PO TABS: 1.0000 | ORAL_TABLET | Freq: Four times a day (QID) | ORAL | 0 refills | Status: DC | PRN

## 2020-06-04 MED ORDER — ACETAMINOPHEN 500 MG PO TABS
1000.0000 mg | ORAL_TABLET | ORAL | Status: AC
  Administered 2020-06-04: 1000 mg via ORAL
  Filled 2020-06-04: qty 2

## 2020-06-04 MED ORDER — LACTATED RINGERS IV SOLN: INTRAVENOUS | Status: DC

## 2020-06-04 MED ORDER — CHLORHEXIDINE GLUCONATE 0.12 % MT SOLN
15.0000 mL | Freq: Once | OROMUCOSAL | Status: AC
  Administered 2020-06-04: 15 mL via OROMUCOSAL

## 2020-06-04 MED ORDER — ORAL CARE MOUTH RINSE: 15.0000 mL | Freq: Once | OROMUCOSAL | Status: AC

## 2020-06-04 MED ORDER — SUGAMMADEX SODIUM 200 MG/2ML IV SOLN
INTRAVENOUS | Status: DC | PRN
  Administered 2020-06-04: 200 mg via INTRAVENOUS

## 2020-06-04 MED ORDER — FENTANYL CITRATE (PF) 100 MCG/2ML IJ SOLN
INTRAMUSCULAR | Status: AC
  Filled 2020-06-04: qty 2

## 2020-06-04 MED ORDER — CHLORHEXIDINE GLUCONATE CLOTH 2 % EX PADS: 6.0000 | MEDICATED_PAD | Freq: Once | CUTANEOUS | Status: DC

## 2020-06-04 MED ORDER — EPHEDRINE SULFATE-NACL 50-0.9 MG/10ML-% IV SOSY
PREFILLED_SYRINGE | INTRAVENOUS | Status: DC | PRN
  Administered 2020-06-04: 5 mg via INTRAVENOUS

## 2020-06-04 MED ORDER — PHENYLEPHRINE 40 MCG/ML (10ML) SYRINGE FOR IV PUSH (FOR BLOOD PRESSURE SUPPORT)
PREFILLED_SYRINGE | INTRAVENOUS | Status: AC
  Filled 2020-06-04: qty 10

## 2020-06-04 MED ORDER — MIDAZOLAM HCL 2 MG/2ML IJ SOLN
INTRAMUSCULAR | Status: DC | PRN
  Administered 2020-06-04: 2 mg via INTRAVENOUS

## 2020-06-04 MED ORDER — DEXAMETHASONE SODIUM PHOSPHATE 10 MG/ML IJ SOLN
INTRAMUSCULAR | Status: AC
  Filled 2020-06-04: qty 1

## 2020-06-04 MED ORDER — ROCURONIUM BROMIDE 10 MG/ML (PF) SYRINGE
PREFILLED_SYRINGE | INTRAVENOUS | Status: AC
  Filled 2020-06-04: qty 10

## 2020-06-04 MED ORDER — PHENYLEPHRINE 40 MCG/ML (10ML) SYRINGE FOR IV PUSH (FOR BLOOD PRESSURE SUPPORT)
PREFILLED_SYRINGE | INTRAVENOUS | Status: DC | PRN
  Administered 2020-06-04: 40 ug via INTRAVENOUS
  Administered 2020-06-04: 80 ug via INTRAVENOUS

## 2020-06-04 MED ORDER — EPHEDRINE 5 MG/ML INJ
INTRAVENOUS | Status: AC
  Filled 2020-06-04: qty 10

## 2020-06-04 MED ORDER — FENTANYL CITRATE (PF) 100 MCG/2ML IJ SOLN
25.0000 ug | INTRAMUSCULAR | Status: DC | PRN
  Administered 2020-06-04: 50 ug via INTRAVENOUS

## 2020-06-04 MED ORDER — LACTATED RINGERS IR SOLN
Status: DC | PRN
  Administered 2020-06-04: 1000 mL

## 2020-06-04 MED ORDER — SCOPOLAMINE 1 MG/3DAYS TD PT72
MEDICATED_PATCH | TRANSDERMAL | Status: AC
  Filled 2020-06-04: qty 1

## 2020-06-04 MED ORDER — 0.9 % SODIUM CHLORIDE (POUR BTL) OPTIME
TOPICAL | Status: DC | PRN
  Administered 2020-06-04: 1000 mL

## 2020-06-04 MED ORDER — GABAPENTIN 300 MG PO CAPS
300.0000 mg | ORAL_CAPSULE | ORAL | Status: AC
  Administered 2020-06-04: 300 mg via ORAL
  Filled 2020-06-04: qty 1

## 2020-06-04 MED ORDER — ROCURONIUM BROMIDE 10 MG/ML (PF) SYRINGE
PREFILLED_SYRINGE | INTRAVENOUS | Status: DC | PRN
  Administered 2020-06-04: 70 mg via INTRAVENOUS

## 2020-06-04 MED ORDER — MIDAZOLAM HCL 2 MG/2ML IJ SOLN
INTRAMUSCULAR | Status: AC
  Filled 2020-06-04: qty 2

## 2020-06-04 MED ORDER — LIDOCAINE 2% (20 MG/ML) 5 ML SYRINGE
INTRAMUSCULAR | Status: DC | PRN
  Administered 2020-06-04: 40 mg via INTRAVENOUS

## 2020-06-04 MED ORDER — DEXAMETHASONE SODIUM PHOSPHATE 10 MG/ML IJ SOLN
INTRAMUSCULAR | Status: DC | PRN
  Administered 2020-06-04: 8 mg via INTRAVENOUS

## 2020-06-04 MED ORDER — SCOPOLAMINE 1 MG/3DAYS TD PT72
1.0000 | MEDICATED_PATCH | TRANSDERMAL | Status: DC
  Administered 2020-06-04: 1.5 mg via TRANSDERMAL

## 2020-06-04 MED ORDER — CEFAZOLIN SODIUM-DEXTROSE 2-4 GM/100ML-% IV SOLN
2.0000 g | INTRAVENOUS | Status: AC
  Administered 2020-06-04: 2 g via INTRAVENOUS
  Filled 2020-06-04: qty 100

## 2020-06-04 MED ORDER — IBUPROFEN 800 MG PO TABS: 800.0000 mg | ORAL_TABLET | Freq: Three times a day (TID) | ORAL | 0 refills | Status: DC | PRN

## 2020-06-04 MED ORDER — ENSURE PRE-SURGERY PO LIQD
296.0000 mL | Freq: Once | ORAL | Status: DC
  Filled 2020-06-04: qty 296

## 2020-06-04 MED ORDER — PROPOFOL 10 MG/ML IV BOLUS
INTRAVENOUS | Status: AC
  Filled 2020-06-04: qty 20

## 2020-06-04 SURGICAL SUPPLY — 51 items
ADH SKN CLS APL DERMABOND .7 (GAUZE/BANDAGES/DRESSINGS) ×1
APL PRP STRL LF DISP 70% ISPRP (MISCELLANEOUS) ×1
APL SKNCLS STERI-STRIP NONHPOA (GAUZE/BANDAGES/DRESSINGS) ×1
APPLIER CLIP 5 13 M/L LIGAMAX5 (MISCELLANEOUS)
APPLIER CLIP ROT 10 11.4 M/L (STAPLE)
APR CLP MED LRG 11.4X10 (STAPLE)
APR CLP MED LRG 5 ANG JAW (MISCELLANEOUS)
BAG SPEC RTRVL 10 TROC 200 (ENDOMECHANICALS) ×1
BENZOIN TINCTURE PRP APPL 2/3 (GAUZE/BANDAGES/DRESSINGS) ×2 IMPLANT
BNDG ADH 1X3 SHEER STRL LF (GAUZE/BANDAGES/DRESSINGS) ×8 IMPLANT
BNDG ADH THN 3X1 STRL LF (GAUZE/BANDAGES/DRESSINGS) ×4
CABLE HIGH FREQUENCY MONO STRZ (ELECTRODE) ×2 IMPLANT
CHLORAPREP W/TINT 26 (MISCELLANEOUS) ×2 IMPLANT
CLIP APPLIE 5 13 M/L LIGAMAX5 (MISCELLANEOUS) IMPLANT
CLIP APPLIE ROT 10 11.4 M/L (STAPLE) IMPLANT
CLIP VESOLOCK LG 6/CT PURPLE (CLIP) IMPLANT
CLIP VESOLOCK MED LG 6/CT (CLIP) ×2 IMPLANT
COVER MAYO STAND STRL (DRAPES) ×2 IMPLANT
COVER SURGICAL LIGHT HANDLE (MISCELLANEOUS) ×2 IMPLANT
COVER WAND RF STERILE (DRAPES) IMPLANT
DECANTER SPIKE VIAL GLASS SM (MISCELLANEOUS) ×2 IMPLANT
DERMABOND ADVANCED (GAUZE/BANDAGES/DRESSINGS) ×1
DERMABOND ADVANCED .7 DNX12 (GAUZE/BANDAGES/DRESSINGS) IMPLANT
DRAIN CHANNEL 19F RND (DRAIN) IMPLANT
DRAPE C-ARM 42X120 X-RAY (DRAPES) IMPLANT
EVACUATOR SILICONE 100CC (DRAIN) IMPLANT
GLOVE BIOGEL PI IND STRL 7.0 (GLOVE) ×1 IMPLANT
GLOVE BIOGEL PI INDICATOR 7.0 (GLOVE) ×1
GLOVE SURG SS PI 7.0 STRL IVOR (GLOVE) ×2 IMPLANT
GOWN STRL REUS W/TWL LRG LVL3 (GOWN DISPOSABLE) ×2 IMPLANT
GOWN STRL REUS W/TWL XL LVL3 (GOWN DISPOSABLE) ×4 IMPLANT
GRASPER SUT TROCAR 14GX15 (MISCELLANEOUS) IMPLANT
KIT BASIN OR (CUSTOM PROCEDURE TRAY) ×2 IMPLANT
KIT TURNOVER KIT A (KITS) IMPLANT
POUCH RETRIEVAL ECOSAC 10 (ENDOMECHANICALS) ×1 IMPLANT
POUCH RETRIEVAL ECOSAC 10MM (ENDOMECHANICALS) ×2
SCISSORS LAP 5X35 DISP (ENDOMECHANICALS) ×2 IMPLANT
SET CHOLANGIOGRAPH MIX (MISCELLANEOUS) IMPLANT
SET IRRIG TUBING LAPAROSCOPIC (IRRIGATION / IRRIGATOR) ×2 IMPLANT
SET TUBE SMOKE EVAC HIGH FLOW (TUBING) ×2 IMPLANT
SLEEVE XCEL OPT CAN 5 100 (ENDOMECHANICALS) ×4 IMPLANT
STOPCOCK 4 WAY LG BORE MALE ST (IV SETS) IMPLANT
STRIP CLOSURE SKIN 1/2X4 (GAUZE/BANDAGES/DRESSINGS) ×2 IMPLANT
SUT ETHILON 2 0 PS N (SUTURE) IMPLANT
SUT MNCRL AB 4-0 PS2 18 (SUTURE) ×2 IMPLANT
SUT VICRYL 0 ENDOLOOP (SUTURE) IMPLANT
TOWEL OR 17X26 10 PK STRL BLUE (TOWEL DISPOSABLE) ×2 IMPLANT
TOWEL OR NON WOVEN STRL DISP B (DISPOSABLE) IMPLANT
TRAY LAPAROSCOPIC (CUSTOM PROCEDURE TRAY) ×2 IMPLANT
TROCAR BLADELESS OPT 5 100 (ENDOMECHANICALS) ×2 IMPLANT
TROCAR XCEL NON-BLD 11X100MML (ENDOMECHANICALS) ×2 IMPLANT

## 2020-06-04 NOTE — Discharge Instructions (Signed)
[facility] ______[facility] LAPAROSCOPIC SURGERY: POST OP INSTRUCTIONS Always review your discharge instruction sheet given to you by the facility where your surgery was performed. IF YOU HAVE DISABILITY OR FAMILY LEAVE FORMS, YOU MUST BRING THEM TO THE OFFICE FOR PROCESSING.   DO NOT GIVE THEM TO YOUR DOCTOR.  1. A prescription for pain medication may be given to you upon discharge.  Take your pain medication as prescribed, if needed.  If narcotic pain medicine is not needed, then you may take acetaminophen (Tylenol) or ibuprofen (Advil) as needed. 2. Take your usually prescribed medications unless otherwise directed. 3. If you need a refill on your pain medication, please contact your pharmacy.  They will contact our office to request authorization. Prescriptions will not be filled after 5pm or on week-ends. 4. You should follow a light diet the first few days after arrival home, such as soup and crackers, etc.  Be sure to include lots of fluids daily. 5. Most patients will experience some swelling and bruising in the area of the incisions.  Ice packs will help.  Swelling and bruising can take several days to resolve.  6. It is common to experience some constipation if taking pain medication after surgery.  Increasing fluid intake and taking a stool softener (such as Colace) will usually help or prevent this problem from occurring.  A mild laxative (Milk of Magnesia or Miralax) should be taken according to package instructions if there are no bowel movements after 48 hours. 7. Unless discharge instructions indicate otherwise, you may remove your bandages 24-48 hours after surgery, and you may shower at that time.  You may have steri-strips (small skin tapes) in place directly over the incision.  These strips should be left on the skin for 7-10 days.  If your surgeon used skin glue on the incision, you may shower in 24 hours.  The glue will flake off over the next 2-3 weeks.  Any sutures or  staples will be removed at the office during your follow-up visit. 8. ACTIVITIES:  You may resume regular (light) daily activities beginning the next day--such as daily self-care, walking, climbing stairs--gradually increasing activities as tolerated.  You may have sexual intercourse when it is comfortable.  Refrain from any heavy lifting or straining until approved by your doctor. a. You may drive when you are no longer taking prescription pain medication, you can comfortably wear a seatbelt, and you can safely maneuver your car and apply brakes. b. RETURN TO WORK:  __________________________________________________________ 9. You should see your doctor in the office for a follow-up appointment approximately 2-3 weeks after your surgery.  Make sure that you call for this appointment within a day or two after you arrive home to insure a convenient appointment time. 10. OTHER INSTRUCTIONS: __________________________________________________________________________________________________________________________ __________________________________________________________________________________________________________________________ WHEN TO CALL YOUR DOCTOR: 1. Fever over 101.0 2. Inability to urinate 3. Continued bleeding from incision. 4. Increased pain, redness, or drainage from the incision. 5. Increasing abdominal pain  The clinic staff is available to answer your questions during regular business hours.  Please don't hesitate to call and ask to speak to one of the nurses for clinical concerns.  If you have a medical emergency, go to the nearest emergency room or call 911.  A surgeon from [facility] is always on call at the hospital. [address], [address], [place], [place]  [address] ? [address] [address], [place], [place]   [address] [phone] ? [phone] ? FAX [phone] Web site:   [email or web address]   General Anesthesia, Adult, Care After This sheet gives  you information about how to care for yourself after your procedure. Your health care provider may also give you more specific instructions. If you have problems or questions, contact your health care provider. What can I expect after the procedure? After the procedure, the following side effects are common:  Pain or discomfort at the IV site.  Nausea.  Vomiting.  Sore throat.  Trouble concentrating.  Feeling cold or chills.  Weak or tired.  Sleepiness and fatigue.  Soreness and body aches. These side effects can affect parts of the body that were not involved in surgery. Follow these instructions at home:  For at least 24 hours after the procedure:  Have a responsible adult stay with you. It is important to have someone help care for you until you are awake and alert.  Rest as needed.  Do not: ? Participate in activities in which you could fall or become injured. ? Drive. ? Use heavy machinery. ? Drink alcohol. ? Take sleeping pills or medicines that cause drowsiness. ? Make important decisions or sign legal documents. ? Take care of children on your own. Eating and drinking  Follow any instructions from your health care provider about eating or drinking restrictions.  When you feel hungry, start by eating small amounts of foods that are soft and easy to digest (bland), such as toast. Gradually return to your regular diet.  Drink enough fluid to keep your urine pale yellow.  If you vomit, rehydrate by drinking water, juice, or clear broth. General instructions  If you have sleep apnea, surgery and certain medicines can increase your risk for breathing problems. Follow instructions from your health care provider about wearing your sleep device: ? Anytime you are sleeping, including during daytime naps. ? While taking prescription pain medicines, sleeping medicines, or medicines that make you drowsy.  Return to your normal activities as told by your health care  provider. Ask your health care provider what activities are safe for you.  Take over-the-counter and prescription medicines only as told by your health care provider.  If you smoke, do not smoke without supervision.  Keep all follow-up visits as told by your health care provider. This is important. Contact a health care provider if:  You have nausea or vomiting that does not get better with medicine.  You cannot eat or drink without vomiting.  You have pain that does not get better with medicine.  You are unable to pass urine.  You develop a skin rash.  You have a fever.  You have redness around your IV site that gets worse. Get help right away if:  You have difficulty breathing.  You have chest pain.  You have blood in your urine or stool, or you vomit blood. Summary  After the procedure, it is common to have a sore throat or nausea. It is also common to feel tired.  Have a responsible adult stay with you for the first 24 hours after general anesthesia. It is important to have someone help care for you until you are awake and alert.  When you feel hungry, start by eating small amounts of foods that are soft and easy to digest (bland), such as toast. Gradually return to your regular diet.  Drink enough fluid to keep your urine pale yellow.  Return to your normal activities as told by your health care provider. Ask your health care provider what activities are safe for you.   This information is not intended to replace advice given to you by your health care provider. Make sure you discuss any questions you have with your health care provider. Document Revised: 09/15/2017 Document Reviewed: 04/28/2017 Elsevier Patient Education  2020 Elsevier Inc.   

## 2020-06-04 NOTE — Transfer of Care (Signed)
Immediate Anesthesia Transfer of Care Note  Patient: Anna Richardson  Procedure(s) Performed: LAPAROSCOPIC CHOLECYSTECTOMY (N/A )  Patient Location: PACU  Anesthesia Type:General  Level of Consciousness: awake, alert  and oriented  Airway & Oxygen Therapy: Patient Spontanous Breathing and Patient connected to face mask oxygen  Post-op Assessment: Report given to RN and Post -op Vital signs reviewed and stable  Post vital signs: Reviewed and stable  Last Vitals:  Vitals Value Taken Time  BP 148/86 06/04/20 1058  Temp    Pulse 80 06/04/20 1100  Resp 14 06/04/20 1100  SpO2 100 % 06/04/20 1100  Vitals shown include unvalidated device data.  Last Pain:  Vitals:   06/04/20 0859  TempSrc: Oral         Complications: No complications documented.

## 2020-06-04 NOTE — Anesthesia Postprocedure Evaluation (Signed)
Anesthesia Post Note  Patient: Anna Richardson  Procedure(s) Performed: LAPAROSCOPIC CHOLECYSTECTOMY (N/A )     Patient location during evaluation: PACU Anesthesia Type: General Level of consciousness: awake and alert Pain management: pain level controlled Vital Signs Assessment: post-procedure vital signs reviewed and stable Respiratory status: spontaneous breathing, nonlabored ventilation, respiratory function stable and patient connected to nasal cannula oxygen Cardiovascular status: blood pressure returned to baseline and stable Postop Assessment: no apparent nausea or vomiting Anesthetic complications: no   No complications documented.  Last Vitals:  Vitals:   06/04/20 1145 06/04/20 1155  BP: 124/80 139/89  Pulse: 64 (!) 59  Resp: 17 16  Temp:  36.7 C  SpO2: 99% 100%    Last Pain:  Vitals:   06/04/20 1155  TempSrc:   PainSc: 3                  Nhyira Leano L Josslynn Mentzer

## 2020-06-04 NOTE — H&P (Signed)
Anna Richardson is an 44 y.o. female.   Chief Complaint: abdominal pain HPI: 44 yo female with multiple month history of abdominal pain and nausea. 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 Anxiety    situational  . Family history of adverse reaction to anesthesia    N&V  . Stutter     Past Surgical History:  Procedure Laterality Date  . CESAREAN SECTION      Family History  Problem Relation Age of Onset  . Cancer Mother 43       breast    Social History:  reports that she has been smoking cigarettes. She has a 5.00 pack-year smoking history. She has never used smokeless tobacco. She reports current alcohol use of about 2.0 standard drinks of alcohol per week. She reports that she does not use drugs.  Allergies:  Allergies  Allergen Reactions  . Ciprofloxacin Swelling    EYE DROPS - Throat swelling    Medications Prior to Admission  Medication Sig Dispense Refill  . citalopram (CELEXA) 20 MG tablet TAKE 1 TABLET BY MOUTH EVERY DAY (Patient taking differently: Take 20 mg by mouth daily. ) 90 tablet 0  . albuterol (VENTOLIN HFA) 108 (90 Base) MCG/ACT inhaler Inhale 2 puffs into the lungs every 6 (six) hours as needed for wheezing or shortness of breath. (Patient not taking: Reported on 05/22/2020) 8 g 0  . amoxicillin-clavulanate (AUGMENTIN) 875-125 MG tablet Take 1 tablet by mouth 2 (two) times daily. Take all of this medication (Patient not taking: Reported on 05/22/2020) 20 tablet 0    Results for orders placed or performed during the hospital encounter of 06/04/20 (from the past 48 hour(s))  Pregnancy, urine     Status: None   Collection Time: 06/04/20  8:45 AM  Result Value Ref Range   Preg Test, Ur NEGATIVE NEGATIVE    Comment:        THE SENSITIVITY OF THIS METHODOLOGY IS >20 mIU/mL. Performed at [facility], [address] [place], [place] [address]    No results  found.  Review of Systems  Constitutional: Negative for chills and fever.  HENT: Negative for hearing loss.   Respiratory: Negative for cough.   Cardiovascular: Negative for chest pain and palpitations.  Gastrointestinal: Negative for abdominal pain, nausea and vomiting.  Genitourinary: Negative for dysuria and urgency.  Musculoskeletal: Negative for myalgias and neck pain.  Skin: Negative for rash.  Neurological: Negative for dizziness and headaches.  Hematological: Does not bruise/bleed easily.  Psychiatric/Behavioral: Negative for suicidal ideas.    Blood pressure 120/82, pulse 73, temperature 98.2 F (36.8 C), temperature source Oral, resp. rate 18, last menstrual period 05/05/2020, SpO2 99 %. Physical Exam Vitals reviewed.  Constitutional:      Appearance: She is well-developed.  HENT:     Head: Normocephalic and atraumatic.  Eyes:     Conjunctiva/sclera: Conjunctivae normal.     Pupils: Pupils are equal, round, and reactive to light.  Cardiovascular:     Rate and Rhythm: Normal rate and regular rhythm.  Pulmonary:     Effort: Pulmonary effort is normal.     Breath sounds: Normal breath sounds.  Abdominal:     General: Bowel sounds are normal. There is no distension.     Palpations: Abdomen is soft.     Tenderness: There is no abdominal tenderness.  Musculoskeletal:        General: Normal range of  motion.     Cervical back: Normal range of motion and neck supple.  Skin:    General: Skin is warm and dry.  Neurological:     Mental Status: She is alert and oriented to person, place, and time.  Psychiatric:        Behavior: Behavior normal.      Assessment/Plan 44 yo female with cholecystitis -lap chole -planned outpatient procedure -ERAS protocol  Rodman Pickle, MD 06/04/2020, 9:14 AM

## 2020-06-04 NOTE — Anesthesia Procedure Notes (Signed)
Procedure Name: Intubation Date/Time: 06/04/2020 9:52 AM Performed by: Eben Burow, CRNA Pre-anesthesia Checklist: Patient identified, Emergency Drugs available, Suction available, Patient being monitored and Timeout performed Patient Re-evaluated:Patient Re-evaluated prior to induction Oxygen Delivery Method: Circle system utilized Preoxygenation: Pre-oxygenation with 100% oxygen Induction Type: IV induction Ventilation: Mask ventilation without difficulty Laryngoscope Size: Mac and 4 Grade View: Grade I Tube type: Oral Tube size: 7.0 mm Number of attempts: 1 Airway Equipment and Method: Stylet Placement Confirmation: ETT inserted through vocal cords under direct vision,  positive ETCO2 and breath sounds checked- equal and bilateral Secured at: 21 cm Tube secured with: Tape Dental Injury: Teeth and Oropharynx as per pre-operative assessment

## 2020-06-04 NOTE — Anesthesia Preprocedure Evaluation (Addendum)
Anesthesia Evaluation  Patient identified by MRN, date of birth, ID band Patient awake    Reviewed: Allergy & Precautions, NPO status , Patient's Chart, lab work & pertinent test results  Airway Mallampati: II  TM Distance: >3 FB Neck ROM: Full    Dental no notable dental hx. (+) Teeth Intact, Dental Advisory Given   Pulmonary neg pulmonary ROS, Current Smoker and Patient abstained from smoking.,    Pulmonary exam normal breath sounds clear to auscultation       Cardiovascular negative cardio ROS Normal cardiovascular exam Rhythm:Regular Rate:Normal     Neuro/Psych PSYCHIATRIC DISORDERS Anxiety negative neurological ROS     GI/Hepatic negative GI ROS, Neg liver ROS,   Endo/Other  negative endocrine ROSObese BMI 35  Renal/GU negative Renal ROS  negative genitourinary   Musculoskeletal negative musculoskeletal ROS (+)   Abdominal   Peds  Hematology negative hematology ROS (+)   Anesthesia Other Findings   Reproductive/Obstetrics                            Anesthesia Physical Anesthesia Plan  ASA: II  Anesthesia Plan: General   Post-op Pain Management:    Induction: Intravenous  PONV Risk Score and Plan: 2 and Midazolam, Dexamethasone and Ondansetron  Airway Management Planned: Oral ETT  Additional Equipment:   Intra-op Plan:   Post-operative Plan: Extubation in OR  Informed Consent: I have reviewed the patients History and Physical, chart, labs and discussed the procedure including the risks, benefits and alternatives for the proposed anesthesia with the patient or authorized representative who has indicated his/her understanding and acceptance.     Dental advisory given  Plan Discussed with: CRNA  Anesthesia Plan Comments:         Anesthesia Quick Evaluation

## 2020-06-04 NOTE — Op Note (Signed)
PATIENT:  Anna Richardson  44 y.o. female  PRE-OPERATIVE DIAGNOSIS:  GALLSTONES  POST-OPERATIVE DIAGNOSIS:  GALLSTONES  PROCEDURE:  Procedure(s): LAPAROSCOPIC CHOLECYSTECTOMY   SURGEON:  Surgeon(s): Madix Blowe, De Blanch, MD  ASSISTANT: none  ANESTHESIA:   local and general  Indications for procedure: [name] is a 44 y.o. female with symptoms of Abdominal pain and Nausea and vomiting consistent with gallbladder disease, Confirmed by Ultrasound.  Description of procedure: The patient was brought into the operative suite, placed supine. Anesthesia was administered with endotracheal tube. Patient was strapped in place and foot board was secured. All pressure points were offloaded by foam padding. The patient was prepped and draped in the usual sterile fashion.  A small incision was made to the right of the umbilicus. A 72mm trocar was inserted into the peritoneal cavity with optical entry. Pneumoperitoneum was applied with high flow low pressure. 2 28mm trocars were placed in the RUQ. A 50mm trocar was placed in the subxiphoid space. Marcaine was infused to the subxiphoid space and lateral upper right abdomen in the transversus abdominis plane. Next the patient was placed in reverse trendelenberg. The gallbladder was white in color and appeared to have stones.  The gallbladder was retracted cephalad and lateral. The peritoneum was reflected off the infundibulum working lateral to medial. The cystic duct and cystic artery were identified and further dissection revealed a critical view. The cystic duct and cystic artery were doubly clipped and ligated.   The gallbladder was removed off the liver bed with cautery. The Gallbladder was placed in a specimen bag. Multiple stones came out during dissection which were recaptured with grasper and removed.The gallbladder fossa was irrigated and hemostasis was applied with cautery. The gallbladder was removed via the 47mm trocar. The fascial  defect was closed with interrupted 0 vicryl suture via laparoscopic trans-fascial suture passer. Pneumoperitoneum was removed, all trocar were removed. All incisions were closed with 4-0 monocryl subcuticular stitch. The patient woke from anesthesia and was brought to PACU in stable condition. All counts were correct  Findings: multiple stones, chronic inflammation of gallbladder  Specimen: gallbladder  Blood loss: 20 ml  Local anesthesia:  30 ml marcaine  Complications: none  PLAN OF CARE: Discharge to home after PACU  PATIENT DISPOSITION:  PACU - hemodynamically stable.  Images:    Feliciana Rossetti, M.D. General, Bariatric, & Minimally Invasive Surgery [facility]

## 2020-06-05 ENCOUNTER — Encounter (HOSPITAL_COMMUNITY): Payer: Self-pay | Admitting: General Surgery

## 2020-06-05 LAB — SURGICAL PATHOLOGY

## 2020-07-06 ENCOUNTER — Other Ambulatory Visit: Payer: Self-pay

## 2020-07-06 ENCOUNTER — Telehealth: Payer: BC Managed Care – PPO | Admitting: Emergency Medicine

## 2020-07-06 ENCOUNTER — Other Ambulatory Visit: Payer: BC Managed Care – PPO

## 2020-07-06 DIAGNOSIS — J988 Other specified respiratory disorders: Secondary | ICD-10-CM | POA: Diagnosis not present

## 2020-07-06 DIAGNOSIS — Z20822 Contact with and (suspected) exposure to covid-19: Secondary | ICD-10-CM

## 2020-07-06 MED ORDER — BENZONATATE 100 MG PO CAPS: 100.0000 mg | ORAL_CAPSULE | Freq: Three times a day (TID) | ORAL | 0 refills | Status: DC | PRN

## 2020-07-06 NOTE — Progress Notes (Signed)
We are sorry that you are not feeling well.  Here is how we plan to help!  Based on your presentation I believe you most likely have A cough due to a virus.  This is called viral bronchitis and is best treated by rest, plenty of fluids and control of the cough.  You may use Ibuprofen or Tylenol as directed to help your symptoms.     In addition you may use A prescription cough medication called Tessalon Perles 100mg . You may take 1-2 capsules every 8 hours as needed for your cough.   I AM UNABLE TO PRESCRIBE ANY LIQUID COUGH MEDICATIONS ON THIS PLATFORM AS THEY CONTAIN CONTROLLED SUBSTANCE MEDICATIONS. ALL NON- NARCOTIC LIQUID MEDICATIONS ARE AVAILABLE OVER THE COUNTER. I SUGGEST YOU TAKE XYZAL 5MG  DAILY AT BEDTIME IF YOU FEEL THIS IS DUE TO ALLERGIES. IF NOT YOU MAY TAKE DAYQUIL/ NYQYUIL OR ANOTHER FORM OF COLD MEDICINE.  From your responses in the eVisit questionnaire you describe inflammation in the upper respiratory tract which is causing a significant cough.  This is commonly called Bronchitis and has four common causes:    Allergies  Viral Infections  Acid Reflux  Bacterial Infection Allergies, viruses and acid reflux are treated by controlling symptoms or eliminating the cause. An example might be a cough caused by taking certain blood pressure medications. You stop the cough by changing the medication. Another example might be a cough caused by acid reflux. Controlling the reflux helps control the cough.  USE OF BRONCHODILATOR ("RESCUE") INHALERS: There is a risk from using your bronchodilator too frequently.  The risk is that over-reliance on a medication which only relaxes the muscles surrounding the breathing tubes can reduce the effectiveness of medications prescribed to reduce swelling and congestion of the tubes themselves.  Although you feel brief relief from the bronchodilator inhaler, your asthma may actually be worsening with the tubes becoming more swollen and filled with  mucus.  This can delay other crucial treatments, such as oral steroid medications. If you need to use a bronchodilator inhaler daily, several times per day, you should discuss this with your provider.  There are probably better treatments that could be used to keep your asthma under control.     HOME CARE . Only take medications as instructed by your medical team. . Complete the entire course of an antibiotic. . Drink plenty of fluids and get plenty of rest. . Avoid close contacts especially the very young and the elderly . Cover your mouth if you cough or cough into your sleeve. . Always remember to wash your hands . A steam or ultrasonic humidifier can help congestion.   GET HELP RIGHT AWAY IF: . You develop worsening fever. . You become short of breath . You cough up blood. . Your symptoms persist after you have completed your treatment plan MAKE SURE YOU   Understand these instructions.  Will watch your condition.  Will get help right away if you are not doing well or get worse.  Your e-visit answers were reviewed by a board certified advanced clinical practitioner to complete your personal care plan.  Depending on the condition, your plan could have included both over the counter or prescription medications. If there is a problem please reply  once you have received a response from your provider. Your safety is important to .  If you have drug allergies check your prescription carefully.    You can use MyChart to ask questions about today's visit, request a non-urgent  call back, or ask for a work or school excuse for 24 hours related to this e-Visit. If it has been greater than 24 hours you will need to follow up with your provider, or enter a new e-Visit to address those concerns. You will get an e-mail in the next two days asking about your experience.  I hope that your e-visit has been valuable and will speed your recovery. Thank you for using e-visits.   **Please do not  respond to this message unless you have follow up questions.** Greater than 5 but less than 10 minutes spent researching, coordinating, and implementing care for this patient today

## 2020-07-06 NOTE — Addendum Note (Signed)
Addended by: Jannifer Rodney A on: 07/06/2020 05:59 PM   Modules accepted: Orders

## 2020-07-07 LAB — SARS-COV-2, NAA 2 DAY TAT

## 2020-07-07 LAB — NOVEL CORONAVIRUS, NAA: SARS-CoV-2, NAA: NOT DETECTED

## 2020-07-15 IMAGING — DX DG WRIST COMPLETE 3+V*R*
3 series · 3 of 3 positions shown · non-contrast
Comparison: None.

CLINICAL DATA: Right wrist pain.

EXAM:
RIGHT WRIST - COMPLETE 3+ VIEW

[wrist ap]
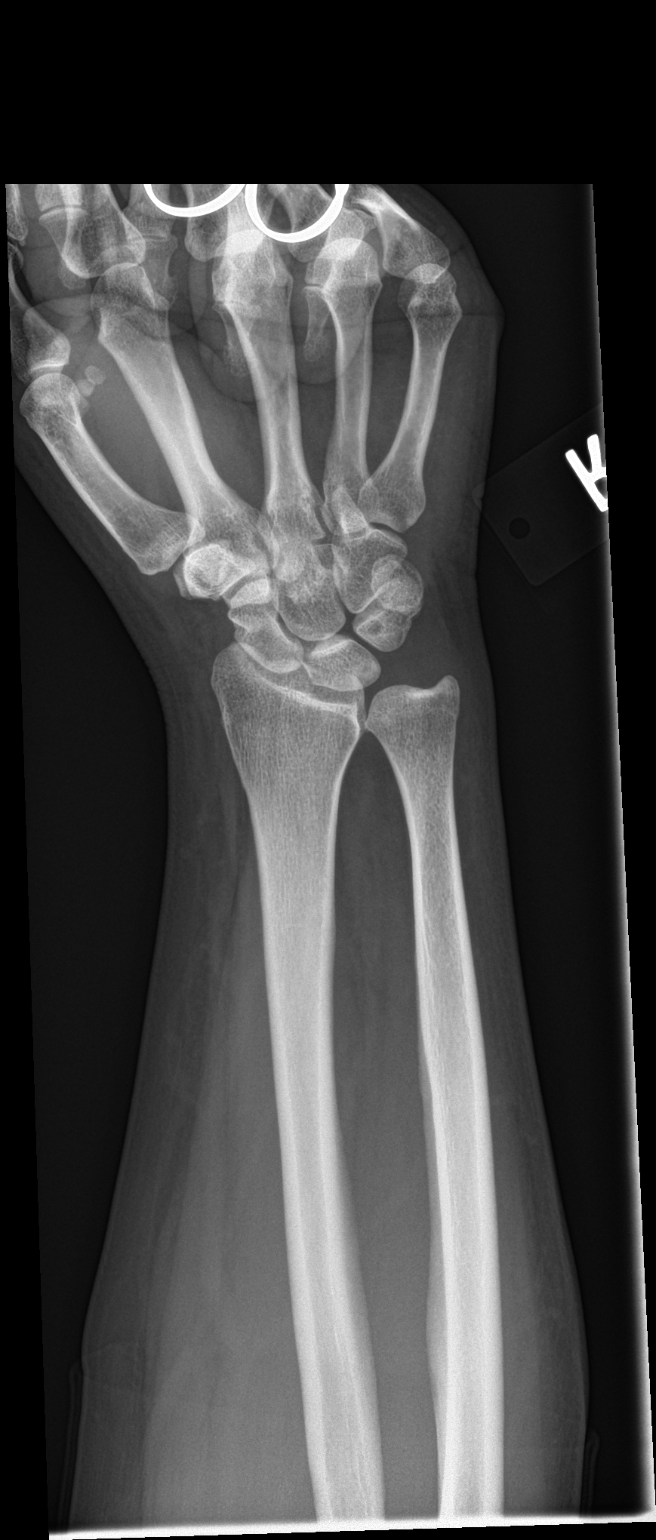

[wrist obl]
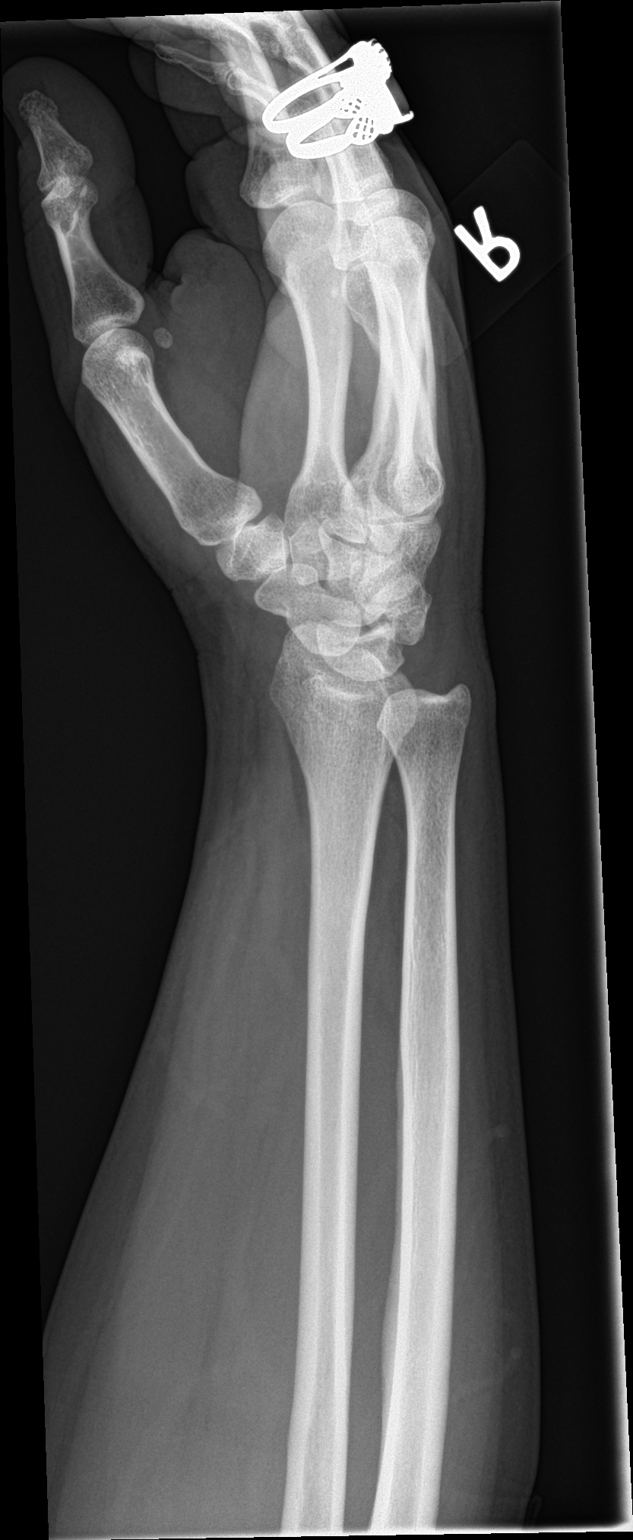

[wrist lat]
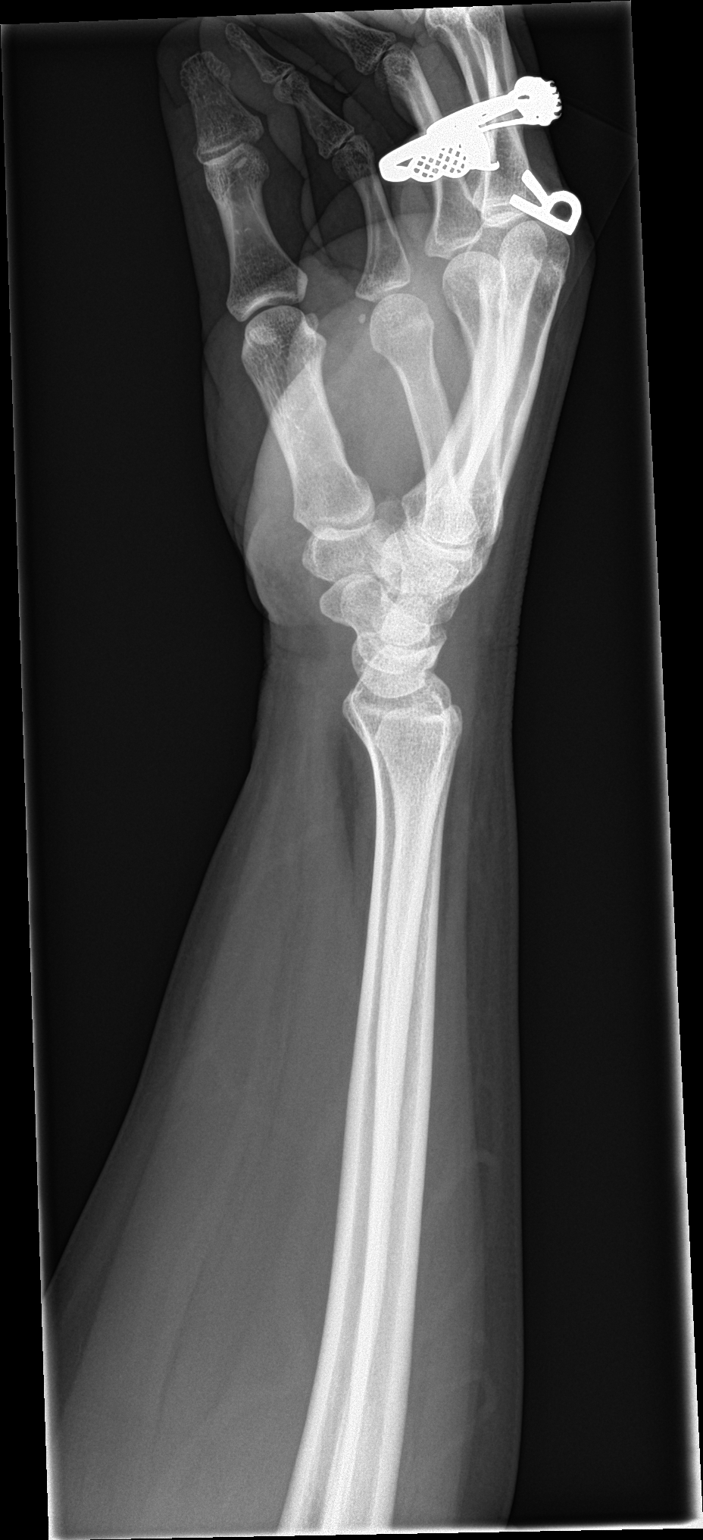

[3 of 3 positions shown; findings below may reference images not displayed]

FINDINGS: There is no evidence of fracture or dislocation. There are small
cystic degenerative changes in the lunate and triquetrum. Soft
tissues are unremarkable.
IMPRESSION: No acute osseous injury of the right wrist.

## 2020-07-27 ENCOUNTER — Telehealth: Payer: BC Managed Care – PPO | Admitting: Nurse Practitioner

## 2020-07-27 DIAGNOSIS — Z716 Tobacco abuse counseling: Secondary | ICD-10-CM

## 2020-07-27 MED ORDER — NICOTINE 21 MG/24HR TD PT24: 21.0000 mg | MEDICATED_PATCH | Freq: Every day | TRANSDERMAL | 0 refills | Status: DC

## 2020-07-27 NOTE — Progress Notes (Signed)
E-Visit for Smoking Cessation  Congratulations for your interest in quitting smoking!  Quitting smoking is one of the most important things you can do to protect your health.  We are here to help you! Did you know that if you quit smoking 1 pack per day you could save up to $2550.00 per year?  Medications are not appropriate for everyone depending upon your situation and any health issues you may have. If we do prescribe medication, it will be for 1 month at time and you will be required to have a follow up E-Visit at 1 month to assess how you are doing and any side effects.  This could be up to a total of 3 months.    Support of your friends, family and work colleagues is very important. Please let them know that you are trying to stop smoking so that they understand your need for support in this goal.  Remove tobacco products from your environment  Set a quit date ideally within 2 weeks.  You should totally abstain from smoking after your quit date. A single puff could hurt your progress or cause you to relapse  If there are others in your household that smoke, ask them to try to quit or abstain from smoking in your presence  You may notice nicotine withdrawal symptoms such as increased appetite and weight gain, changes in mood, insomnia, irritability and/or anxiety. These symptoms peak in the first three days after smoking cessation and subside over the next 3-4 weeks.   I have prescribed a Nicotine patch for you.  Apply the patch to a non-hairy skin site and rotate the site daily. You will start using a 21 mg patch daily for six weeks.  You may use over the counter Nicotine gum. Chew one piece of gum every one to two hours while awake and when there is an urge to smoke. Use up to 24 pieces of gum per day for six weeks. Then gradually reduce use over a second six weeks. Then gradually reduce use over a second six weeks. Chew the gum slowly - using the chew and park method. Chew the gum  until the nicotine taste appears, then park the gum until the taste disappears, then chew again to release more nicotine. Chew the gum for 30 minutes.  A combination of behavioral and medication can improve the success of you quitting smoking. You may want to use the 1-800-QUIT-NOW free support line.  Also, the Department of Health and Human Services provides Smoke free apps for smartphones: SharedCustomer.fi  If you happen to break your plan and have a cigarette, keep taking your medications and continue to try to abstain.  Do not give up!  MAKE SURE YOU   Take any prescribed medications only as instructed.   If you miss a dose of medication, take the next dose when it is due and get back on schedule. DO NOT double up on medications.   Mark your calendar to do your Follow Up Smoking Cessation E-Visit in one month   Your e-visit answers were reviewed by a board certified advanced clinical practitioner to complete your personal care plan.  Depending on the condition, your plan could have included both over the counter or prescription medications.  Your safety is important to [place].  If you have drug allergies check your prescription carefully.    You can use MyChart to ask questions about today's visit, request a non-urgent call back, or ask for a work or school excuse  for 24 hours related to this e-Visit. If it has been greater than 24 hours you will need to follow up with your provider or enter a new e-Visit to address those concerns.    You will get an e-mail asking about your experience.  We hope that your e-visit has been valuable.  Thank you for using e-visits.  5-10 minutes spent reviewing and documenting in chart.

## 2020-12-30 ENCOUNTER — Other Ambulatory Visit: Payer: Self-pay

## 2021-02-25 ENCOUNTER — Ambulatory Visit (INDEPENDENT_AMBULATORY_CARE_PROVIDER_SITE_OTHER): Payer: Self-pay | Admitting: Family

## 2021-02-25 ENCOUNTER — Encounter: Payer: Self-pay | Admitting: Family

## 2021-02-25 ENCOUNTER — Other Ambulatory Visit: Payer: Self-pay | Admitting: Family

## 2021-02-25 DIAGNOSIS — R6889 Other general symptoms and signs: Secondary | ICD-10-CM

## 2021-02-25 MED ORDER — OSELTAMIVIR PHOSPHATE 75 MG PO CAPS: 75.0000 mg | ORAL_CAPSULE | Freq: Two times a day (BID) | ORAL | 0 refills | Status: DC

## 2021-02-25 NOTE — Progress Notes (Signed)
   Virtual Visit  Note Due to COVID-19 pandemic this visit was conducted virtually. This visit type was conducted due to national recommendations for restrictions regarding the COVID-19 Pandemic (e.g. social distancing, sheltering in place) in an effort to limit this patient's exposure and mitigate transmission in our community. All issues noted in this document were discussed and addressed.  A physical exam was not performed with this format.  I connected with Anna Richardson on 02/25/21 at 12:10 pm  by telephone and verified that I am speaking with the correct person using two identifiers. Anna Richardson is currently located at home and no one is currently with her during visit. The provider, Jannifer Rodney, FNP is located in their office at time of visit.  I discussed the limitations, risks, security and privacy concerns of performing an evaluation and management service by telephone and the availability of in person appointments. I also discussed with the patient that there may be a patient responsible charge related to this service. The patient expressed understanding and agreed to proceed.   History and Present Illness:  Pt presents with flu like symptoms that started yesterday. She reports she has taken two COVID tests at home and they were negative.  Cough This is a new problem. The current episode started yesterday. The problem has been gradually worsening. The problem occurs every few minutes. The cough is non-productive. Associated symptoms include chills, a fever (100.3 f), headaches and myalgias. Pertinent negatives include no ear congestion, ear pain, nasal congestion, postnasal drip, sore throat or shortness of breath. She has tried rest for the symptoms. The treatment provided mild relief.      Review of Systems  Constitutional: Positive for chills and fever (100.3 f).  HENT: Negative for ear pain, postnasal drip and sore throat.   Respiratory: Positive for cough. Negative  for shortness of breath.   Musculoskeletal: Positive for myalgias.  Neurological: Positive for headaches.     Observations/Objective: No SOB or distress noted, nasal congestion noted.   Assessment and Plan: 1. Flu-like symptoms Rest Force fluids Tylenol as needed  RTO if symptoms worsen or do not improve  - oseltamivir (TAMIFLU) 75 MG capsule; Take 1 capsule (75 mg total) by mouth 2 (two) times daily.  Dispense: 10 capsule; Refill: 0     I discussed the assessment and treatment plan with the patient. The patient was provided an opportunity to ask questions and all were answered. The patient agreed with the plan and demonstrated an understanding of the instructions.   The patient was advised to call back or seek an in-person evaluation if the symptoms worsen or if the condition fails to improve as anticipated.  The above assessment and management plan was discussed with the patient. The patient verbalized understanding of and has agreed to the management plan. Patient is aware to call the clinic if symptoms persist or worsen. Patient is aware when to return to the clinic for a follow-up visit. Patient educated on when it is appropriate to go to the emergency department.   Time call ended:  12:21 pm   I provided 11 minutes of  non face-to-face time during this encounter.    Jannifer Rodney, FNP

## 2021-03-10 ENCOUNTER — Encounter: Payer: BC Managed Care – PPO | Admitting: Family Medicine

## 2021-03-11 ENCOUNTER — Encounter: Payer: BC Managed Care – PPO | Admitting: Family Medicine

## 2021-04-12 ENCOUNTER — Ambulatory Visit (INDEPENDENT_AMBULATORY_CARE_PROVIDER_SITE_OTHER): Payer: BC Managed Care – PPO | Admitting: Family Medicine

## 2021-04-12 ENCOUNTER — Other Ambulatory Visit: Payer: Self-pay

## 2021-04-12 ENCOUNTER — Encounter: Payer: Self-pay | Admitting: Family Medicine

## 2021-04-12 VITALS — BP 123/87 | HR 71 | Temp 97.8°F | Ht 64.0 in | Wt 213.2 lb

## 2021-04-12 DIAGNOSIS — L680 Hirsutism: Secondary | ICD-10-CM

## 2021-04-12 DIAGNOSIS — Z0001 Encounter for general adult medical examination with abnormal findings: Secondary | ICD-10-CM | POA: Diagnosis not present

## 2021-04-12 DIAGNOSIS — Z6835 Body mass index (BMI) 35.0-35.9, adult: Secondary | ICD-10-CM

## 2021-04-12 DIAGNOSIS — R197 Diarrhea, unspecified: Secondary | ICD-10-CM | POA: Diagnosis not present

## 2021-04-12 DIAGNOSIS — K580 Irritable bowel syndrome with diarrhea: Secondary | ICD-10-CM

## 2021-04-12 DIAGNOSIS — M25559 Pain in unspecified hip: Secondary | ICD-10-CM | POA: Diagnosis not present

## 2021-04-12 DIAGNOSIS — Z0000 Encounter for general adult medical examination without abnormal findings: Secondary | ICD-10-CM

## 2021-04-12 MED ORDER — RIFAXIMIN 550 MG PO TABS: 550.0000 mg | ORAL_TABLET | Freq: Two times a day (BID) | ORAL | 2 refills | Status: DC

## 2021-04-12 MED ORDER — CITALOPRAM HYDROBROMIDE 40 MG PO TABS
40.0000 mg | ORAL_TABLET | Freq: Every day | ORAL | 2 refills | Status: DC
Start: 2021-04-12 — End: 2021-05-25

## 2021-04-12 NOTE — Progress Notes (Signed)
Subjective:  Patient ID: Anna Richardson, female    DOB: January 01, 1976  Age: 45 y.o. MRN: [id number]  CC: Annual Exam, Weight Gain, and Diarrhea   HPI Anna Richardson presents for IBS with diarrhea. Goes every 3-4 hours. Onset with GB surgery last year. Has fecal urgency. No pain. No hematochezia. No constipation. BMs are loose.   Menses are heavier for the last year. Growing facial hair at chin and neck.  She had an evaluation with hormones and was determined to have PCOS.  Your hormone levels are reviewed and are in normal range.  Tires easily.  Occurs every 2 mos for 3 days. Denies fever, hot flashes.   Weight gain of 20 -25 lbsin the last year.   Stressed out can't handle it. Just wants to sleep.   GAD 7 : Generalized Anxiety Score 04/12/2021  Nervous, Anxious, on Edge 1  Control/stop worrying 1  Worry too much - different things 1  Trouble relaxing 0  Restless 0  Easily annoyed or irritable 0  Afraid - awful might happen 0  Total GAD 7 Score 3  Anxiety Difficulty Somewhat difficult      Depression screen [facility] 2/9 04/12/2021 09/10/2019 12/05/2018  Decreased Interest 0 0 0  Down, Depressed, Hopeless 1 0 0  PHQ - 2 Score 1 0 0  Altered sleeping 0 - -  Tired, decreased energy 1 - -  Change in appetite 1 - -  Feeling bad or failure about yourself  1 - -  Trouble concentrating 0 - -  Moving slowly or fidgety/restless 0 - -  Suicidal thoughts 0 - -  PHQ-9 Score 4 - -  Difficult doing work/chores Not difficult at all - -    History Anna Richardson has a past medical history of Anxiety, Family history of adverse reaction to anesthesia, and Stutter.   She has a past surgical history that includes Cesarean section and Cholecystectomy (N/A, 06/04/2020).   Her family history includes Cancer (age of onset: 42) in her mother.She reports that she has been smoking cigarettes. She has a 5.00 pack-year smoking history. She has never used smokeless tobacco. She reports current alcohol use of  about 2.0 standard drinks of alcohol per week. She reports that she does not use drugs.    ROS Review of Systems  Constitutional:  Positive for activity change, fatigue and unexpected weight change.  HENT:  Negative for congestion.   Eyes:  Negative for visual disturbance.  Respiratory:  Negative for shortness of breath.   Cardiovascular:  Negative for chest pain.  Gastrointestinal:  Positive for abdominal pain and diarrhea. Negative for constipation, nausea and vomiting.  Genitourinary:  Negative for difficulty urinating.  Musculoskeletal:  Negative for arthralgias and myalgias.  Neurological:  Negative for headaches.  Psychiatric/Behavioral:  Negative for sleep disturbance.    Objective:  BP 123/87   Pulse 71   Temp 97.8 F (36.6 C) (Temporal)   Ht 5' 4" (1.626 m)   Wt 213 lb 3.2 oz (96.7 kg)   LMP 03/15/2021   SpO2 98%   BMI 36.60 kg/m   BP Readings from Last 3 Encounters:  04/12/21 123/87  06/04/20 117/81  05/29/20 127/73    Wt Readings from Last 3 Encounters:  04/12/21 213 lb 3.2 oz (96.7 kg)  05/29/20 205 lb 7 oz (93.2 kg)  09/10/19 218 lb 6.4 oz (99.1 kg)     Physical Exam Constitutional:      General: She is not in acute distress.  Appearance: She is well-developed.  HENT:     Head: Normocephalic and atraumatic.  Eyes:     Conjunctiva/sclera: Conjunctivae normal.     Pupils: Pupils are equal, round, and reactive to light.  Neck:     Thyroid: No thyromegaly.  Cardiovascular:     Rate and Rhythm: Normal rate and regular rhythm.     Heart sounds: Normal heart sounds. No murmur heard. Pulmonary:     Effort: Pulmonary effort is normal. No respiratory distress.     Breath sounds: Normal breath sounds. No wheezing or rales.  Abdominal:     General: Bowel sounds are normal. There is distension.     Palpations: Abdomen is soft.     Tenderness: There is no abdominal tenderness.  Musculoskeletal:        General: Normal range of motion.     Cervical  back: Normal range of motion and neck supple.  Lymphadenopathy:     Cervical: No cervical adenopathy.  Skin:    General: Skin is warm and dry.  Neurological:     Mental Status: She is alert and oriented to person, place, and time.  Psychiatric:        Behavior: Behavior normal.        Thought Content: Thought content normal.        Judgment: Judgment normal.      Assessment & Plan:   Anna Richardson was seen today for annual exam, weight gain and diarrhea.  Diagnoses and all orders for this visit:  Well adult exam -     CBC with Differential/Platelet -     CMP14+EGFR -     Urinalysis  Hirsutism -     TSH + free T4 -     FSH/LH -     Estrogens, Total -     Progesterone -     Testosterone,Free and Total  Diarrhea, unspecified type -     Stool culture -     Clostridium difficile EIA -     Giardia/Cryptosporidium EIA -     Ambulatory referral to Gastroenterology  Hip pain -     Sedimentation rate  Irritable bowel syndrome with diarrhea  BMI 35.0-35.9,adult  Other orders -     citalopram (CELEXA) 40 MG tablet; Take 1 tablet (40 mg total) by mouth daily. -     rifaximin (XIFAXAN) 550 MG TABS tablet; Take 1 tablet (550 mg total) by mouth 2 (two) times daily.      I have discontinued [place] oseltamivir. I have also changed her citalopram. Additionally, I am having her start on rifaximin.  Allergies as of 04/12/2021       Reactions   Ciprofloxacin Swelling   EYE DROPS - Throat swelling        Medication List        Accurate as of April 12, 2021 11:59 PM. If you have any questions, ask your nurse or doctor.          STOP taking these medications    oseltamivir 75 MG capsule Commonly known as: Tamiflu Stopped by: Claretta Fraise, MD       TAKE these medications    citalopram 40 MG tablet Commonly known as: CELEXA Take 1 tablet (40 mg total) by mouth daily. What changed:  medication strength how much to take Changed by: Claretta Fraise,  MD   rifaximin 550 MG Tabs tablet Commonly known as: XIFAXAN Take 1 tablet (550 mg total) by mouth 2 (two) times daily. Started  by: Claretta Fraise, MD         Follow-up: Return in about 6 weeks (around 05/24/2021).  Claretta Fraise, M.D.

## 2021-04-13 ENCOUNTER — Other Ambulatory Visit: Payer: BC Managed Care – PPO

## 2021-04-14 LAB — CMP14+EGFR
ALT: 14 IU/L (ref 0–32)
AST: 13 IU/L (ref 0–40)
Albumin/Globulin Ratio: 1.5 (ref 1.2–2.2)
Albumin: 4.1 g/dL (ref 3.8–4.8)
Alkaline Phosphatase: 94 IU/L (ref 44–121)
BUN/Creatinine Ratio: 20 (ref 9–23)
BUN: 13 mg/dL (ref 6–24)
Bilirubin Total: 0.2 mg/dL (ref 0.0–1.2)
CO2: 19 mmol/L — ABNORMAL LOW (ref 20–29)
Calcium: 8.9 mg/dL (ref 8.7–10.2)
Chloride: 102 mmol/L (ref 96–106)
Creatinine, Ser: 0.64 mg/dL (ref 0.57–1.00)
Globulin, Total: 2.8 g/dL (ref 1.5–4.5)
Glucose: 102 mg/dL — ABNORMAL HIGH (ref 65–99)
Potassium: 4.4 mmol/L (ref 3.5–5.2)
Sodium: 135 mmol/L (ref 134–144)
Total Protein: 6.9 g/dL (ref 6.0–8.5)
eGFR: 112 mL/min/{1.73_m2} (ref >59)

## 2021-04-14 LAB — CBC WITH DIFFERENTIAL/PLATELET
Basophils Absolute: 0.1 10*3/uL (ref 0.0–0.2)
Basos: 1 %
EOS (ABSOLUTE): 0.3 10*3/uL (ref 0.0–0.4)
Eos: 4 %
Hematocrit: 41.1 % (ref 34.0–46.6)
Hemoglobin: 13.2 g/dL (ref 11.1–15.9)
Immature Grans (Abs): 0 10*3/uL (ref 0.0–0.1)
Immature Granulocytes: 0 %
Lymphocytes Absolute: 2 10*3/uL (ref 0.7–3.1)
Lymphs: 29 %
MCH: 30.7 pg (ref 26.6–33.0)
MCHC: 32.1 g/dL (ref 31.5–35.7)
MCV: 96 fL (ref 79–97)
Monocytes Absolute: 0.4 10*3/uL (ref 0.1–0.9)
Monocytes: 5 %
Neutrophils Absolute: 4.2 10*3/uL (ref 1.4–7.0)
Neutrophils: 61 %
Platelets: 312 10*3/uL (ref 150–450)
RBC: 4.3 x10E6/uL (ref 3.77–5.28)
RDW: 13.1 % (ref 11.7–15.4)
WBC: 7 10*3/uL (ref 3.4–10.8)

## 2021-04-14 LAB — SEDIMENTATION RATE: Sed Rate: 14 mm/hr (ref 0–32)

## 2021-04-14 LAB — ESTROGENS, TOTAL: Estrogen: 201 pg/mL

## 2021-04-14 LAB — TSH+FREE T4
Free T4: 1.18 ng/dL (ref 0.82–1.77)
TSH: 1.62 u[IU]/mL (ref 0.450–4.500)

## 2021-04-14 LAB — FSH/LH
FSH: 3.1 m[IU]/mL
LH: 2.8 m[IU]/mL

## 2021-04-14 LAB — TESTOSTERONE,FREE AND TOTAL
Testosterone, Free: 1.1 pg/mL (ref 0.0–4.2)
Testosterone: 19 ng/dL (ref 4–50)

## 2021-04-14 LAB — PROGESTERONE: Progesterone: 3.6 ng/mL

## 2021-04-15 ENCOUNTER — Encounter: Payer: Self-pay | Admitting: Family Medicine

## 2021-04-17 LAB — STOOL CULTURE: E coli, Shiga toxin Assay: NEGATIVE

## 2021-04-17 LAB — GIARDIA/CRYPTOSPORIDIUM EIA
Cryptosporidium EIA: NEGATIVE
Giardia Ag, Stl: NEGATIVE

## 2021-05-25 ENCOUNTER — Encounter: Payer: Self-pay | Admitting: Family Medicine

## 2021-05-25 ENCOUNTER — Other Ambulatory Visit: Payer: Self-pay

## 2021-05-25 ENCOUNTER — Ambulatory Visit: Payer: BC Managed Care – PPO | Admitting: Family Medicine

## 2021-05-25 VITALS — BP 117/64 | HR 67 | Temp 98.0°F | Ht 64.0 in | Wt 213.0 lb

## 2021-05-25 DIAGNOSIS — F411 Generalized anxiety disorder: Secondary | ICD-10-CM

## 2021-05-25 DIAGNOSIS — K580 Irritable bowel syndrome with diarrhea: Secondary | ICD-10-CM | POA: Diagnosis not present

## 2021-05-25 MED ORDER — CITALOPRAM HYDROBROMIDE 40 MG PO TABS: 40.0000 mg | ORAL_TABLET | Freq: Every day | ORAL | 2 refills | Status: DC

## 2021-05-25 NOTE — Patient Instructions (Signed)
Irritable Bowel Syndrome, Adult  Irritable bowel syndrome (IBS) is a group of symptoms that affects the organs responsible for digestion (gastrointestinal or GI tract). IBS is not one specific disease. To regulate how the GI tract works, the body sends signals back and forth between the intestines and the brain. If you have IBS, there may be a problem with these signals. As a result, the GI tract does not function normally. The intestines may become more sensitive and overreact to certain things. This maybe especially true when you eat certain foods or when you are under stress. There are four types of IBS. These may be determined based on the consistency of your stool (feces): IBS with diarrhea. IBS with constipation. Mixed IBS. Unsubtyped IBS. It is important to know which type of IBS you have. Certain treatments are morelikely to be helpful for certain types of IBS. What are the causes? The exact cause of IBS is not known. What increases the risk? You may have a higher risk for IBS if you: Are female. Are younger than 40. Have a family history of IBS. Have a mental health condition, such as depression, anxiety, or post-traumatic stress disorder. Have had a bacterial infection of your GI tract. What are the signs or symptoms? Symptoms of IBS vary from person to person. The main symptom is abdominal pain or discomfort. Other symptoms usually include one or more of the following: Diarrhea, constipation, or both. Abdominal swelling or bloating. Feeling full after eating a small or regular-sized meal. Frequent gas. Mucus in the stool. A feeling of having more stool left after a bowel movement. Symptoms tend to come and go. They may be triggered by stress, mental healthconditions, or certain foods. How is this diagnosed? This condition may be diagnosed based on a physical exam, your medical history, and your symptoms. You may have tests, such as: Blood tests. Stool test. X-rays. CT  scan. Colonoscopy. This is a procedure in which your GI tract is viewed with a long, thin, flexible tube. How is this treated? There is no cure for IBS, but treatment can help relieve symptoms. Treatment depends on the type of IBS you have, and may include: Changes to your diet, such as: Avoiding foods that cause symptoms. Drinking more water. Following a low-FODMAP (fermentable oligosaccharides, disaccharides, monosaccharides, and polyols) diet for up to 6 weeks, or as told by your health care provider. FODMAPs are sugars that are hard for some people to digest. Eating more fiber. Eating medium-sized meals at the same times every day. Medicines. These may include: Fiber supplements, if you have constipation. Medicine to control diarrhea (antidiarrheal medicines). Medicine to help control muscle tightening (spasms) in your GI tract (antispasmodic medicines). Medicines to help with mental health conditions, such as antidepressants or tranquilizers. Talk therapy or counseling. Working with a diet and nutrition specialist (dietitian) to help create a food plan that is right for you. Managing your stress. Follow these instructions at home: Eating and drinking Eat a healthy diet. Eat medium-sized meals at about the same time every day. Do not eat large meals. Gradually eat more fiber-rich foods. These include whole grains, fruits, and vegetables. This may be especially helpful if you have IBS with constipation. Eat a diet low in FODMAPs. Drink enough fluid to keep your urine pale yellow. Keep a journal of foods that seem to trigger symptoms. Avoid foods and drinks that: Contain added sugar. Make your symptoms worse. Dairy products, caffeinated drinks, and carbonated drinks can make symptoms worse for some   people. General instructions Take over-the-counter and prescription medicines and supplements only as told by your health care provider. Get enough exercise. Do at least 150 minutes of  moderate-intensity exercise each week. Manage your stress. Getting enough sleep and exercise can help you manage stress. Keep all follow-up visits as told by your health care provider and therapist. This is important. Alcohol Use Do not drink alcohol if: Your health care provider tells you not to drink. You are pregnant, may be pregnant, or are planning to become pregnant. If you drink alcohol, limit how much you have: 0-1 drink a day for women. 0-2 drinks a day for men. Be aware of how much alcohol is in your drink. In the [place] one drink equals one typical bottle of beer (12 oz), one-half glass of wine (5 oz), or one shot of hard liquor (1 oz). Contact a health care provider if you have: Constant pain. Weight loss. Difficulty or pain when swallowing. Diarrhea that gets worse. Get help right away if you have: Severe abdominal pain. Fever. Diarrhea with symptoms of dehydration, such as dizziness or dry mouth. Bright red blood in your stool. Stool that is black and tarry. Abdominal swelling. Vomiting that does not stop. Blood in your vomit. Summary Irritable bowel syndrome (IBS) is not one specific disease. It is a group of symptoms that affects digestion. Your intestines may become more sensitive and overreact to certain things. This may be especially true when you eat certain foods or when you are under stress. There is no cure for IBS, but treatment can help relieve symptoms. This information is not intended to replace advice given to you by your health care provider. Make sure you discuss any questions you have with your healthcare provider. Document Revised: 05/14/2020 Document Reviewed: 05/14/2020 Elsevier Patient Education  2022 [employer]  

## 2021-05-25 NOTE — Progress Notes (Signed)
Established Patient Office Visit  Subjective:  Patient ID: Anna Richardson, female    DOB: [birth year]  Age: 45 y.o. MRN: [id number]  CC:  Chief Complaint  Patient presents with   Anxiety    HPI Anna Richardson presents for follow up of anxiety and IBS. She was started on Celexa for anxiety and xifaxen for IBS with diarrhea at her last visit. She reports doing great on both without side effects. She reports that her symptoms of diarrhea have improved by 90%. She denies abdominal pain. She has been referred to GI, however she has not received a call regarding an appointment with them.   Depression screen [facility] 2/9 05/25/2021 04/12/2021 09/10/2019  Decreased Interest 0 0 0  Down, Depressed, Hopeless 0 1 0  PHQ - 2 Score 0 1 0  Altered sleeping 0 0 -  Tired, decreased energy 0 1 -  Change in appetite 0 1 -  Feeling bad or failure about yourself  0 1 -  Trouble concentrating 0 0 -  Moving slowly or fidgety/restless 0 0 -  Suicidal thoughts 0 0 -  PHQ-9 Score 0 4 -  Difficult doing work/chores Not difficult at all Not difficult at all -   GAD 7 : Generalized Anxiety Score 05/25/2021 04/12/2021  Nervous, Anxious, on Edge 0 1  Control/stop worrying 0 1  Worry too much - different things 1 1  Trouble relaxing 0 0  Restless 0 0  Easily annoyed or irritable 0 0  Afraid - awful might happen 0 0  Total GAD 7 Score 1 3  Anxiety Difficulty Not difficult at all Somewhat difficult       Past Medical History:  Diagnosis Date   Anxiety    situational   Family history of adverse reaction to anesthesia    N&V   Stutter     Past Surgical History:  Procedure Laterality Date   CESAREAN SECTION     CHOLECYSTECTOMY N/A 06/04/2020   Procedure: LAPAROSCOPIC CHOLECYSTECTOMY;  Surgeon: Richardson, Anna Bruce, MD;  Location: [facility];  Service: General;  Laterality: N/A;    Family History  Problem Relation Age of Onset   Cancer Mother 21       breast     Social History   Socioeconomic  History   Marital status: Married    Spouse name: Not on file   Number of children: Not on file   Years of education: Not on file   Highest education level: Not on file  Occupational History   Not on file  Tobacco Use   Smoking status: Every Day    Packs/day: 0.50    Years: 10.00    Pack years: 5.00    Types: Cigarettes   Smokeless tobacco: Never  Vaping Use   Vaping Use: Never used  Substance and Sexual Activity   Alcohol use: Yes    Alcohol/week: 2.0 standard drinks    Types: 2 Cans of beer per week   Drug use: No   Sexual activity: Yes  Other Topics Concern   Not on file  Social History Narrative   Not on file   Social Determinants of Health   Financial Resource Strain: Not on file  Food Insecurity: Not on file  Transportation Needs: Not on file  Physical Activity: Not on file  Stress: Not on file  Social Connections: Not on file  Intimate Partner Violence: Not on file    Outpatient Medications Prior to Visit  Medication Sig Dispense  Refill   citalopram (CELEXA) 40 MG tablet Take 1 tablet (40 mg total) by mouth daily. 30 tablet 2   rifaximin (XIFAXAN) 550 MG TABS tablet Take 1 tablet (550 mg total) by mouth 2 (two) times daily. 60 tablet 2   No facility-administered medications prior to visit.    Allergies  Allergen Reactions   Ciprofloxacin Swelling    EYE DROPS - Throat swelling    ROS Review of Systems Negative unless specially indicated above in HPI.   Objective:    Physical Exam Vitals and nursing note reviewed.  Constitutional:      General: She is not in acute distress.    Appearance: She is not ill-appearing, toxic-appearing or diaphoretic.  Cardiovascular:     Rate and Rhythm: Normal rate and regular rhythm.     Heart sounds: Normal heart sounds. No murmur heard. Pulmonary:     Effort: Pulmonary effort is normal. No respiratory distress.     Breath sounds: Normal breath sounds.  Abdominal:     General: Bowel sounds are normal. There  is no distension.     Palpations: Abdomen is soft.     Tenderness: There is no abdominal tenderness. There is no guarding or rebound.  Skin:    General: Skin is dry.  Neurological:     General: No focal deficit present.     Mental Status: She is alert and oriented to person, place, and time.  Psychiatric:        Mood and Affect: Mood normal.        Behavior: Behavior normal.    BP 117/64   Pulse 67   Temp 98 F (36.7 C) (Temporal)   Ht _0  (1.626 m)   Wt 213 lb (96.6 kg)   BMI 36.56 kg/m  Wt Readings from Last 3 Encounters:  05/25/21 213 lb (96.6 kg)  04/12/21 213 lb 3.2 oz (96.7 kg)  05/29/20 205 lb 7 oz (93.2 kg)     Health Maintenance Due  Topic Date Due   Pneumococcal Vaccine 35-29 Years old (1 - PCV) Never done   HIV Screening  Never done   Hepatitis C Screening  Never done   PAP SMEAR-Modifier  12/13/2019    There are no preventive care reminders to display for this patient.  Lab Results  Component Value Date   TSH 1.620 04/12/2021   Lab Results  Component Value Date   WBC 7.0 04/12/2021   HGB 13.2 04/12/2021   HCT 41.1 04/12/2021   MCV 96 04/12/2021   PLT 312 04/12/2021   Lab Results  Component Value Date   NA 135 04/12/2021   K 4.4 04/12/2021   CO2 19 (L) 04/12/2021   GLUCOSE 102 (H) 04/12/2021   BUN 13 04/12/2021   CREATININE 0.64 04/12/2021   BILITOT 0.2 04/12/2021   ALKPHOS 94 04/12/2021   AST 13 04/12/2021   ALT 14 04/12/2021   PROT 6.9 04/12/2021   ALBUMIN 4.1 04/12/2021   CALCIUM 8.9 04/12/2021   ANIONGAP 8 05/29/2020   EGFR 112 04/12/2021   No results found for: CHOL No results found for: HDL No results found for: LDLCALC No results found for: TRIG No results found for: CHOLHDL No results found for: HGBA1C    Assessment & Plan:   Anna Richardson was seen today for anxiety.  Diagnoses and all orders for this visit:  Generalized anxiety disorder Well controlled on current regimen. Refills provided.  -     citalopram (CELEXA)  40 MG tablet;  Take 1 tablet (40 mg total) by mouth daily.  Irritable bowel syndrome with diarrhea Reports improvement with Xifaxan. Discussed that xifaxan is not indicated for long term use. Discussed to discontinue usage and follow up with symptoms return. GI referral has been authorized for [facility], discussed this with patient to call to schedule an appointment.    Follow-up: Return in about 6 months (around 11/23/2021) for with PCP for follow up.   The patient indicates understanding of these issues and agrees with the plan.  Gwenlyn Perking, FNP

## 2021-05-31 ENCOUNTER — Encounter: Payer: Self-pay | Admitting: Family Medicine

## 2021-06-01 ENCOUNTER — Other Ambulatory Visit: Payer: Self-pay | Admitting: Family Medicine

## 2021-06-01 MED ORDER — COLESTIPOL HCL 5 G PO GRAN: 5.0000 g | GRANULES | Freq: Two times a day (BID) | ORAL | 12 refills | Status: DC

## 2021-07-19 ENCOUNTER — Telehealth: Payer: BC Managed Care – PPO | Admitting: Family Medicine

## 2021-07-19 ENCOUNTER — Encounter: Payer: Self-pay | Admitting: Family Medicine

## 2021-07-19 DIAGNOSIS — J069 Acute upper respiratory infection, unspecified: Secondary | ICD-10-CM | POA: Diagnosis not present

## 2021-07-19 MED ORDER — BENZONATATE 100 MG PO CAPS: 100.0000 mg | ORAL_CAPSULE | Freq: Three times a day (TID) | ORAL | 0 refills | Status: DC | PRN

## 2021-07-19 MED ORDER — PREDNISONE 20 MG PO TABS: ORAL_TABLET | ORAL | 0 refills | Status: DC

## 2021-07-19 MED ORDER — DOXYCYCLINE HYCLATE 100 MG PO TABS: 100.0000 mg | ORAL_TABLET | Freq: Two times a day (BID) | ORAL | 0 refills | Status: AC

## 2021-07-19 MED ORDER — ALBUTEROL SULFATE HFA 108 (90 BASE) MCG/ACT IN AERS: 2.0000 | INHALATION_SPRAY | Freq: Four times a day (QID) | RESPIRATORY_TRACT | 0 refills | Status: DC | PRN

## 2021-07-19 NOTE — Patient Instructions (Signed)

## 2021-07-19 NOTE — Telephone Encounter (Signed)
Ok to provide letter stating that smoking cessation was recommended.

## 2021-07-19 NOTE — Progress Notes (Signed)
MyChart Video visit  Subjective: CC: URI PCP: Mechele Claude, MD GYI:RSWNIOEV [name] is a 45 y.o. female. Patient provides verbal consent for consult held via video.  Due to COVID-19 pandemic this visit was conducted virtually. This visit type was conducted due to national recommendations for restrictions regarding the COVID-19 Pandemic (e.g. social distancing, sheltering in place) in an effort to limit this patient's exposure and mitigate transmission in our community. All issues noted in this document were discussed and addressed.  A physical exam was not performed with this format.   Location of patient: home Location of provider: [facility] Others present for call: none  1. Cough Patient reports onset of dry cough that started about 1 week ago.  She reports that the cough has not worsened but not getting better.  She is using Mucinex, nyquill and OTC allergy meds. No fever, headache, sore throat.  She reports some fatigue and some shortness of breath with coughing spells and with moving around.  No known sick contacts.  She is an active smoker.  No COPD/ asthma diagnosis.  On Friday she tested for COVID19 and it was negative.     ROS: Per HPI  Allergies  Allergen Reactions   Ciprofloxacin Swelling    EYE DROPS - Throat swelling   Past Medical History:  Diagnosis Date   Anxiety    situational   Family history of adverse reaction to anesthesia    N&V   Stutter     Current Outpatient Medications:    citalopram (CELEXA) 40 MG tablet, Take 1 tablet (40 mg total) by mouth daily., Disp: 90 tablet, Rfl: 2   colestipol (COLESTID) 5 g granules, Take 5 g by mouth 2 (two) times daily., Disp: 500 g, Rfl: 12   rifaximin (XIFAXAN) 550 MG TABS tablet, Take 1 tablet (550 mg total) by mouth 2 (two) times daily., Disp: 60 tablet, Rfl: 2  Gen: coughing nontoxic female Pulmonary: normal WOB on room air Neuro: speech delayed  Assessment/ Plan: 45 y.o. female   Viral URI with cough - Plan:  predniSONE (DELTASONE) 20 MG tablet, doxycycline (VIBRA-TABS) 100 MG tablet, albuterol (VENTOLIN HFA) 108 (90 Base) MCG/ACT inhaler, benzonatate (TESSALON PERLES) 100 MG capsule, Veritor Flu A/B Waived, Novel Coronavirus, NAA ([facility])  Since she is a smoker experiencing wheeze/ sob, I'm going to treat her as COPD with abx/ prednisone and inhaler.  Discussed red flags warranting in office evaluation/ emergent evaluation. Work note provided.  Flu and COVID testing recommended but she wants to see how she responds to abx first. Follow up prn.  Start time: 12:48pm End time: 12:59pm  Total time spent on patient care (including video visit/ documentation): 11 minutes  Anna Richardson Hulen Skains, DO [facility] [phone]

## 2021-09-01 ENCOUNTER — Encounter: Payer: Self-pay | Admitting: Family Medicine

## 2021-10-29 ENCOUNTER — Ambulatory Visit: Payer: BC Managed Care – PPO | Admitting: Nurse Practitioner

## 2021-10-29 ENCOUNTER — Encounter: Payer: Self-pay | Admitting: Nurse Practitioner

## 2021-10-29 VITALS — BP 112/76 | HR 86 | Temp 97.6°F | Resp 20 | Ht 64.0 in | Wt 215.0 lb

## 2021-10-29 DIAGNOSIS — J029 Acute pharyngitis, unspecified: Secondary | ICD-10-CM | POA: Diagnosis not present

## 2021-10-29 DIAGNOSIS — R519 Headache, unspecified: Secondary | ICD-10-CM | POA: Diagnosis not present

## 2021-10-29 LAB — VERITOR FLU A/B WAIVED
Influenza A: NEGATIVE
Influenza B: NEGATIVE

## 2021-10-29 LAB — CULTURE, GROUP A STREP

## 2021-10-29 LAB — RAPID STREP SCREEN (MED CTR MEBANE ONLY): Strep Gp A Ag, IA W/Reflex: NEGATIVE

## 2021-10-29 MED ORDER — AZITHROMYCIN 250 MG PO TABS: ORAL_TABLET | ORAL | 0 refills | Status: DC

## 2021-10-29 NOTE — Progress Notes (Signed)
° °  Subjective:    Patient ID: Anna Richardson, female    DOB: [birth year], 46 y.o.   MRN: [id number]  Chief Complaint: Sore Throat and Headache   Sore Throat  This is a new problem. The current episode started yesterday. The problem has been unchanged. Neither side of throat is experiencing more pain than the other. There has been no fever. The pain is at a severity of 6/10. The pain is moderate. Associated symptoms include headaches, swollen glands and trouble swallowing. Pertinent negatives include no congestion, coughing or shortness of breath. She has had no exposure to strep or mono. She has tried NSAIDs for the symptoms. The treatment provided mild relief. Home covid test was negative.     Review of Systems  Constitutional:  Negative for chills and fever.  HENT:  Positive for sore throat and trouble swallowing. Negative for congestion, rhinorrhea and sinus pressure.   Respiratory:  Negative for cough and shortness of breath.   Neurological:  Positive for headaches.      Objective:   Physical Exam Constitutional:      Appearance: She is well-developed.  HENT:     Right Ear: Tympanic membrane normal.     Left Ear: Tympanic membrane normal.     Nose: No congestion or rhinorrhea.     Mouth/Throat:     Mouth: Mucous membranes are moist.     Pharynx: Pharyngeal swelling and posterior oropharyngeal erythema present.     Tonsils: 1+ on the right. 1+ on the left.  Cardiovascular:     Rate and Rhythm: Normal rate and regular rhythm.  Pulmonary:     Effort: Pulmonary effort is normal.     Breath sounds: Normal breath sounds.  Abdominal:     Palpations: Abdomen is soft.  Skin:    General: Skin is warm.  Neurological:     Mental Status: She is alert.    BP 112/76    Pulse 86    Temp 97.6 F (36.4 C) (Temporal)    Resp 20    Ht 5\' 4"  (1.626 m)    Wt 215 lb (97.5 kg)    SpO2 94%    BMI 36.90 kg/m   Flu and strep negative     Assessment & Plan:  Anna Richardson in today  with chief complaint of Sore Throat   1. Sore throat Force fluids Motrin or tylenol OTC OTC decongestant Throat lozenges if help New toothbrush in 3 days  Meds ordered this encounter  Medications   azithromycin (ZITHROMAX Z-PAK) 250 MG tablet    Sig: As directed    Dispense:  6 tablet    Refill:  0    Order Specific Question:   Supervising Provider    Answer:   Caryl Pina A [id number]    - Rapid Strep Screen ([facility] ONLY)  2. Acute nonintractable headache, unspecified headache type - Veritor Flu A/B Waived    The above assessment and management plan was discussed with the patient. The patient verbalized understanding of and has agreed to the management plan. Patient is aware to call the clinic if symptoms persist or worsen. Patient is aware when to return to the clinic for a follow-up visit. Patient educated on when it is appropriate to go to the emergency department.   Mary-Margaret Hassell Done, FNP

## 2021-10-29 NOTE — Patient Instructions (Signed)
Sore Throat When you have a sore throat, your throat may feel: Tender. Burning. Irritated. Scratchy. Painful when you swallow. Painful when you talk. Many things can cause a sore throat, such as: An infection. Allergies. Dry air. Smoke or pollution. Radiation treatment for cancer. Gastroesophageal reflux disease (GERD). A tumor. A sore throat can be the first sign of another sickness. It can happen with other problems, like: Coughing. Sneezing. Fever. Swelling of the glands in the neck. Most sore throats go away without treatment. Follow these instructions at home:   Medicines Take over-the-counter and prescription medicines only as told by your doctor. Children often get sore throats. Do not give your child aspirin. Use throat sprays to soothe your throat as told by your health care provider. Managing pain To help with pain: Sip warm liquids, such as broth, herbal tea, or warm water. Eat or drink cold or frozen liquids, such as frozen ice pops. Rinse your mouth (gargle) with a salt water mixture 3-4 times a day or as needed. To make salt water, dissolve -1 tsp (3-6 g) of salt in 1 cup (237 mL) of warm water. Do not swallow this mixture. Suck on hard candy or throat lozenges. Put a cool-mist humidifier in your bedroom at night. Sit in the bathroom with the door closed for 5-10 minutes while you run hot water in the shower. General instructions Do not smoke or use any products that contain nicotine or tobacco. If you need help quitting, ask your doctor. Get plenty of rest. Drink enough fluid to keep your pee (urine) pale yellow. Wash your hands often for at least 20 seconds with soap and water. If soap and water are not available, use hand sanitizer. Contact a doctor if: You have a fever for more than 2-3 days. You keep having symptoms for more than 2-3 days. Your throat does not get better in 7 days. You have a fever and your symptoms suddenly get worse. Your child  who is 3 months to 3 years old has a temperature of 102.2F (39C) or higher. Get help right away if: You have trouble breathing. You cannot swallow fluids, soft foods, or your spit. You have swelling in your throat or neck that gets worse. You feel like you may vomit (nauseous) and this feeling lasts a long time. You cannot stop vomiting. These symptoms may be an emergency. Get help right away. Call your local emergency services (911 in the [place] Do not wait to see if the symptoms will go away. Do not drive yourself to the hospital. Summary A sore throat is a painful, burning, irritated, or scratchy throat. Many things can cause a sore throat. Take over-the-counter medicines only as told by your doctor. Get plenty of rest. Drink enough fluid to keep your pee (urine) pale yellow. Contact a doctor if your symptoms get worse or your sore throat does not get better within 7 days. This information is not intended to replace advice given to you by your health care provider. Make sure you discuss any questions you have with your health care provider. Document Revised: 12/09/2020 Document Reviewed: 12/09/2020 Elsevier Patient Education  2022 [employer]vier Inc.  

## 2021-11-01 NOTE — Telephone Encounter (Signed)
Ok for work note? 

## 2021-12-06 ENCOUNTER — Encounter: Payer: Self-pay | Admitting: Family Medicine

## 2021-12-06 ENCOUNTER — Ambulatory Visit: Payer: BC Managed Care – PPO | Admitting: Family Medicine

## 2021-12-06 VITALS — BP 115/74 | HR 102 | Temp 97.9°F | Ht 64.0 in | Wt 214.8 lb

## 2021-12-06 DIAGNOSIS — J029 Acute pharyngitis, unspecified: Secondary | ICD-10-CM

## 2021-12-06 LAB — RAPID STREP SCREEN (MED CTR MEBANE ONLY): Strep Gp A Ag, IA W/Reflex: POSITIVE — AB

## 2021-12-06 MED ORDER — AMOXICILLIN-POT CLAVULANATE 875-125 MG PO TABS: 1.0000 | ORAL_TABLET | Freq: Two times a day (BID) | ORAL | 0 refills | Status: DC

## 2021-12-06 MED ORDER — PREDNISONE 10 MG PO TABS: ORAL_TABLET | ORAL | 0 refills | Status: DC

## 2021-12-06 NOTE — Progress Notes (Addendum)
? ?Subjective:  ?Patient ID: Anna Richardson, female    DOB: [birth year]  Age: 46 y.o. MRN: [id number] ? ?CC: Sore Throat ? ? ?HPI ?Anna Richardson presents for sore throat onset 24-30  hours ago. Fever to 100. Neck swollen. Headache. Kind of nauseated. Hurts too bad to swallow.Motrin 5 hours not helping. ? ?Depression screen [facility] 2/9 12/06/2021 10/29/2021 05/25/2021  ?Decreased Interest 0 0 0  ?Down, Depressed, Hopeless 0 0 0  ?PHQ - 2 Score 0 0 0  ?Altered sleeping - - 0  ?Tired, decreased energy - - 0  ?Change in appetite - - 0  ?Feeling bad or failure about yourself  - - 0  ?Trouble concentrating - - 0  ?Moving slowly or fidgety/restless - - 0  ?Suicidal thoughts - - 0  ?PHQ-9 Score - - 0  ?Difficult doing work/chores - - Not difficult at all  ? ? ?History ?Anna Richardson has a past medical history of Anxiety, Family history of adverse reaction to anesthesia, and Stutter.  ? ?She has a past surgical history that includes Cesarean section and Cholecystectomy (N/A, 06/04/2020).  ? ?Her family history includes Cancer (age of onset: 68) in her mother.She reports that she has been smoking cigarettes. She has a 5.00 pack-year smoking history. She has never used smokeless tobacco. She reports current alcohol use of about 2.0 standard drinks per week. She reports that she does not use drugs. ? ? ? ?ROS ?Review of Systems  ?Constitutional:  Positive for fatigue and fever. Negative for appetite change, chills and diaphoresis.  ?HENT:  Positive for ear pain. Negative for congestion, hearing loss, postnasal drip, rhinorrhea, sore throat and trouble swallowing.   ?Respiratory:  Negative for cough, chest tightness and shortness of breath.   ?Cardiovascular:  Negative for chest pain and palpitations.  ?Gastrointestinal:  Negative for abdominal pain.  ?Musculoskeletal:  Negative for arthralgias.  ?Skin:  Negative for rash.  ? ?Objective:  ?BP 115/74   Pulse (!) 102   Temp 97.9 ?F (36.6 ?C)   Ht 5\' 4"  (1.626 m)   Wt 214 lb 12.8 oz  (97.4 kg)   SpO2 96%   BMI 36.87 kg/m?  ? ?BP Readings from Last 3 Encounters:  ?12/06/21 115/74  ?10/29/21 112/76  ?05/25/21 117/64  ? ? ?Wt Readings from Last 3 Encounters:  ?12/06/21 214 lb 12.8 oz (97.4 kg)  ?10/29/21 215 lb (97.5 kg)  ?05/25/21 213 lb (96.6 kg)  ? ? ? ?Physical Exam ?HENT:  ?   Head: Normocephalic.  ?   Right Ear: Tympanic membrane normal.  ?   Left Ear: Tympanic membrane normal.  ?   Mouth/Throat:  ?   Mouth: Mucous membranes are moist. No oral lesions.  ?   Pharynx: Uvula midline. Pharyngeal swelling, oropharyngeal exudate and posterior oropharyngeal erythema present.  ?Neck:  ?   Thyroid: No thyromegaly.  ?Cardiovascular:  ?   Rate and Rhythm: Normal rate and regular rhythm.  ?   Heart sounds: Normal heart sounds.  ?Pulmonary:  ?   Breath sounds: Normal breath sounds.  ?Musculoskeletal:  ?   Cervical back: Normal range of motion and neck supple.  ?Lymphadenopathy:  ?   Cervical: Cervical adenopathy present.  ? ? ? ? ?Assessment & Plan:  ? ?Anna Richardson was seen today for sore throat. ? ?Diagnoses and all orders for this visit: ? ?Sore throat ?-     Rapid Strep Screen ([facility] ONLY) ? ?Other orders ?-     amoxicillin-clavulanate (AUGMENTIN)  875-125 MG tablet; Take 1 tablet by mouth 2 (two) times daily. Take all of this medication ?-     predniSONE (DELTASONE) 10 MG tablet; Take2 twicea day for 5 days. ? ? ? ? ? ? ?I am having Anna Richardson start on amoxicillin-clavulanate and predniSONE. I am also having her maintain her citalopram, albuterol, and azithromycin. ? ?Allergies as of 12/06/2021   ? ?   Reactions  ? Ciprofloxacin Swelling  ? EYE DROPS - Throat swelling  ? ?  ? ?  ?Medication List  ?  ? ?  ? Accurate as of December 06, 2021 12:31 PM. If you have any questions, ask your nurse or doctor.  ?  ?  ? ?  ? ?albuterol 108 (90 Base) MCG/ACT inhaler ?Commonly known as: VENTOLIN HFA ?Inhale 2 puffs into the lungs every 6 (six) hours as needed for wheezing or shortness of breath. ?   ?amoxicillin-clavulanate 875-125 MG tablet ?Commonly known as: AUGMENTIN ?Take 1 tablet by mouth 2 (two) times daily. Take all of this medication ?Started by: Mechele Claude, MD ?  ?azithromycin 250 MG tablet ?Commonly known as: Zithromax Z-Pak ?As directed ?  ?citalopram 40 MG tablet ?Commonly known as: CELEXA ?Take 1 tablet (40 mg total) by mouth daily. ?  ?predniSONE 10 MG tablet ?Commonly known as: DELTASONE ?Take2 twicea day for 5 days. ?Started by: Mechele Claude, MD ?  ? ?  ? ? ? ?Follow-up: Return if symptoms worsen or fail to improve. ? ?Mechele Claude, M.D. ?

## 2022-02-11 IMAGING — US US ABDOMEN LIMITED
1 series · 14 of 25 positions shown · non-contrast
Comparison: None.

CLINICAL DATA: Upper abdominal pain

EXAM:
ULTRASOUND ABDOMEN LIMITED RIGHT UPPER QUADRANT

[Series 1: us abdomen limited · 0.17mm/px · 14 of 89 slices shown]
[im 1/89]
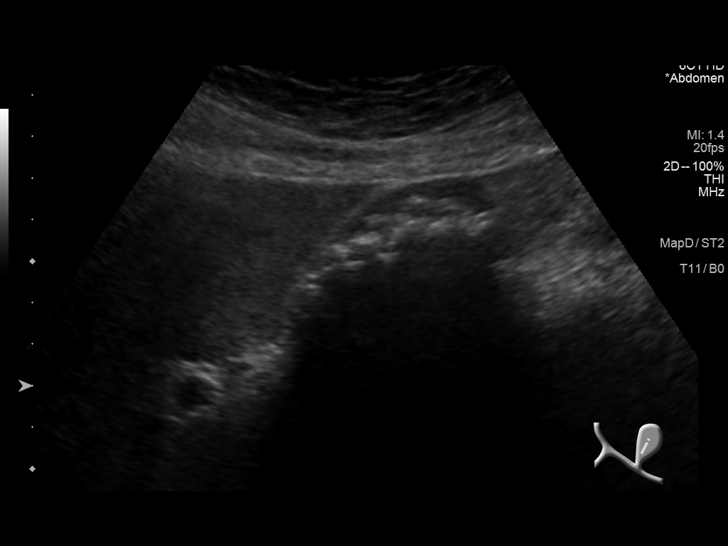
[im 8/89]
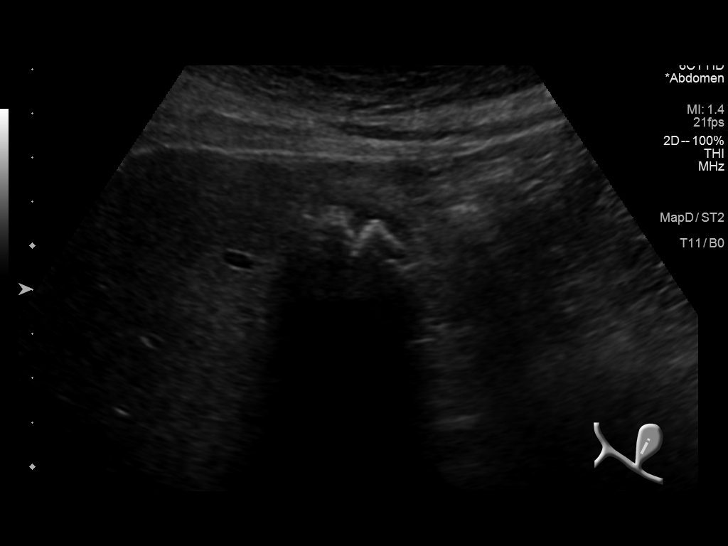
[im 15/89]
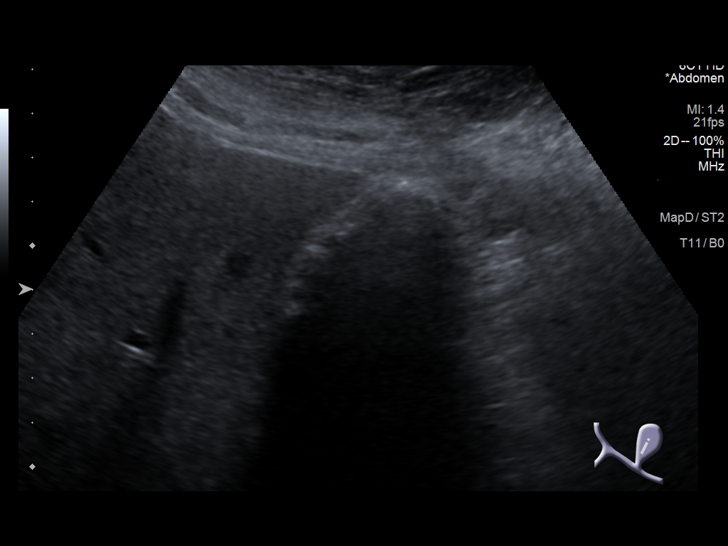
[im 23/89]
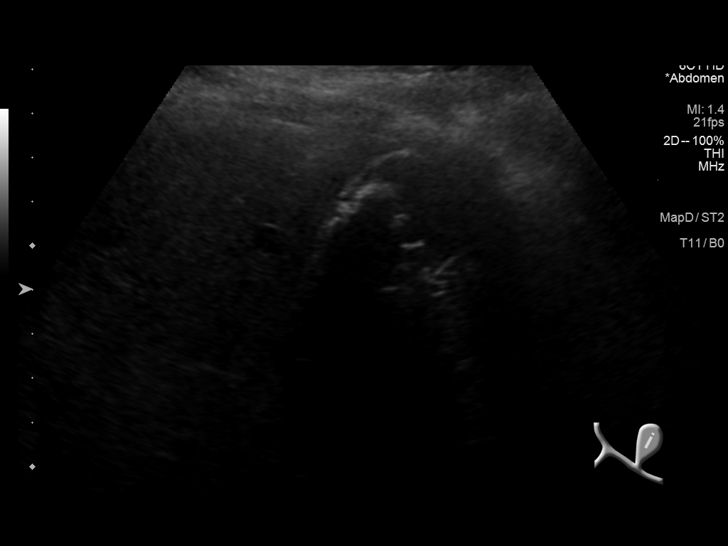
[im 30/89]
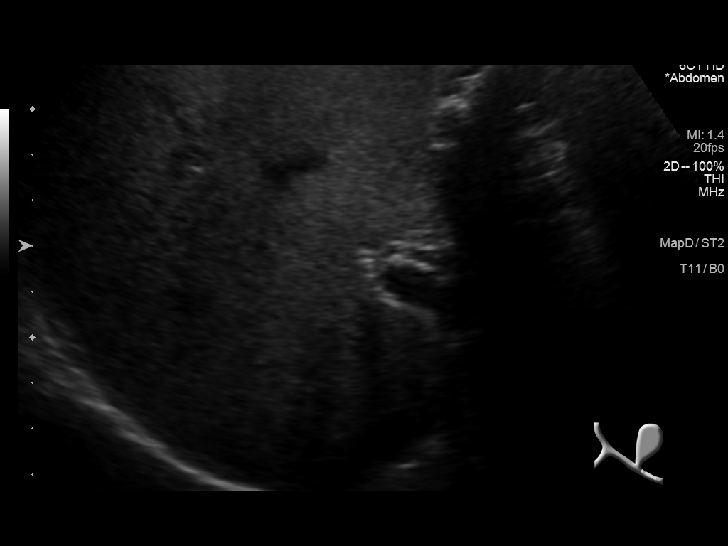
[im 34/89]
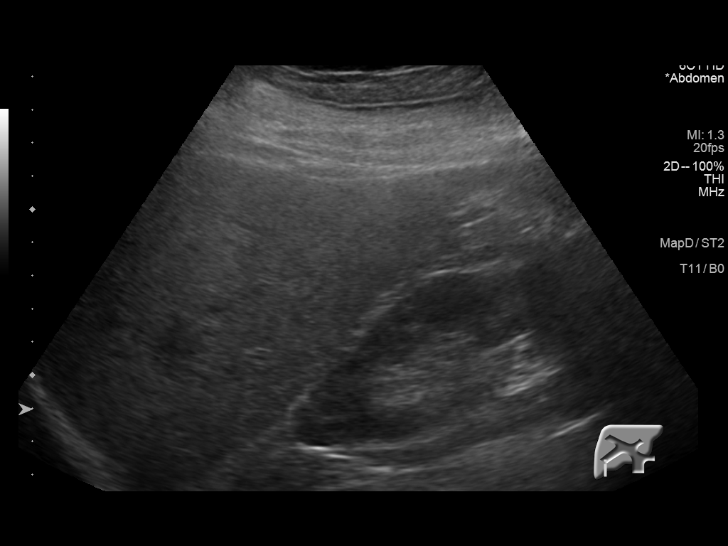
[im 41/89]
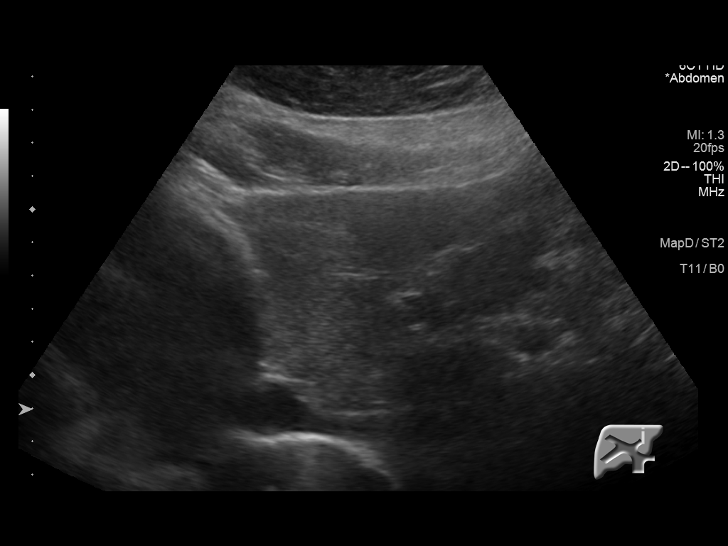
[im 48/89]
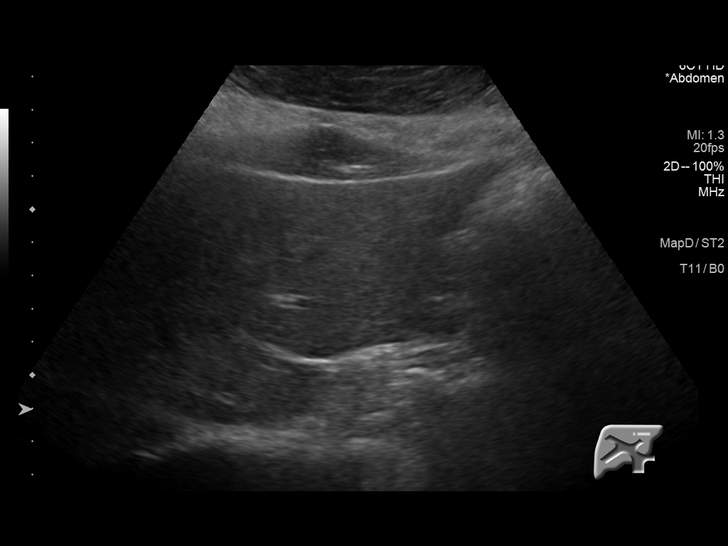
[im 56/89]
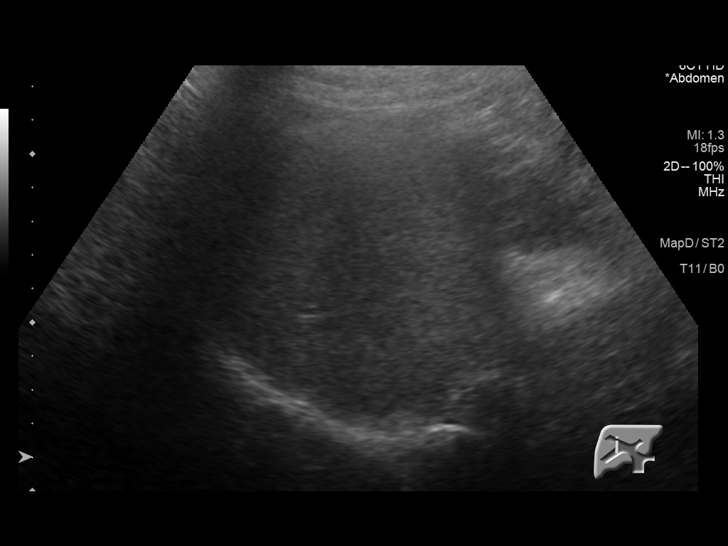
[im 59/89]
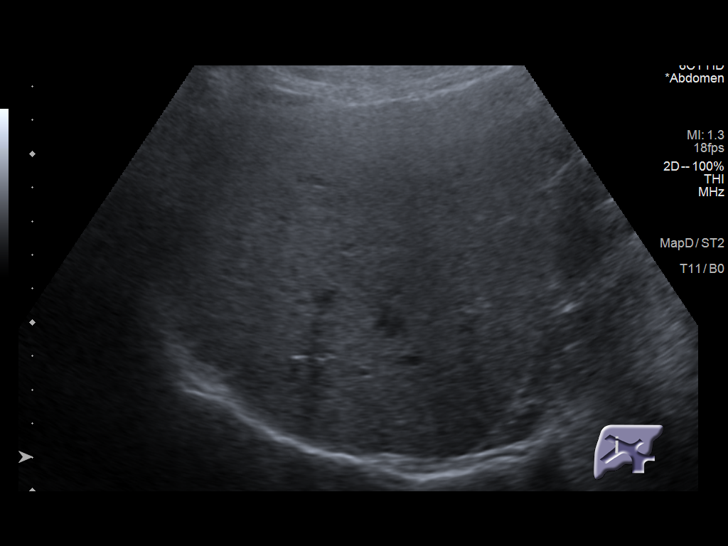
[im 67/89]
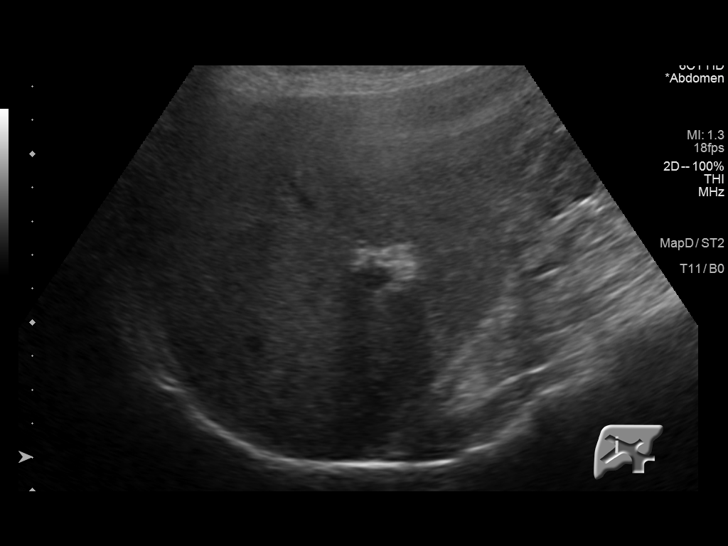
[im 74/89]
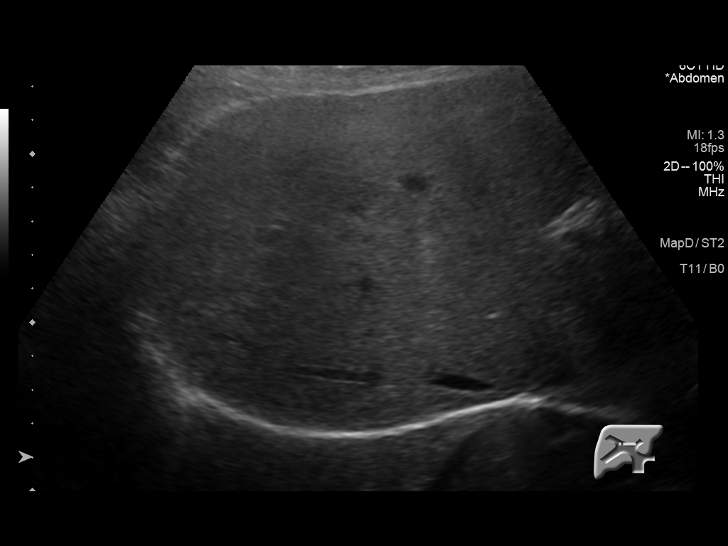
[im 81/89]
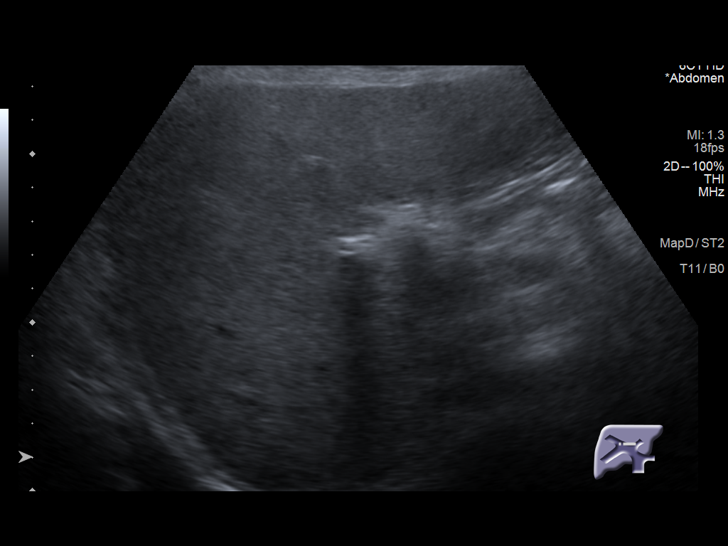
[im 89/89]
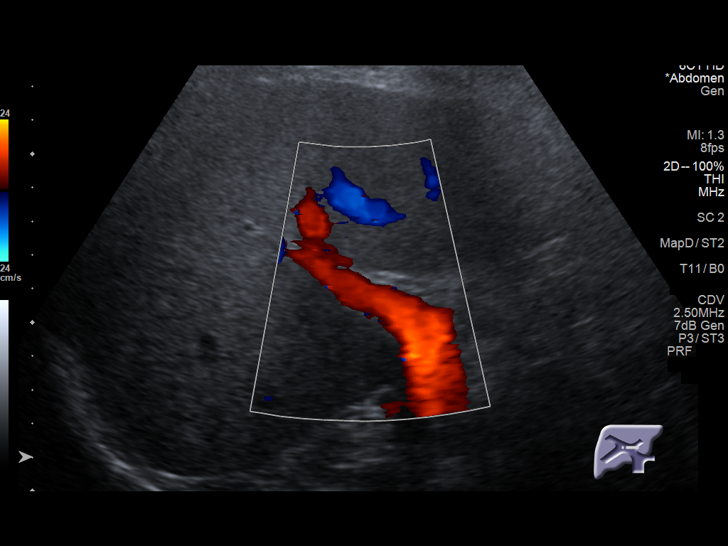

[14 of 25 positions shown; findings below may reference images not displayed]

FINDINGS: Gallbladder:

Within the gallbladder, there are echogenic foci which move and
shadow consistent with cholelithiasis. Largest gallstone measures
1.3 cm in length. There is no gallbladder wall thickening or
pericholecystic fluid. No sonographic Murphy sign noted by
sonographer.

Common bile duct:

Diameter: 2 mm. No intrahepatic or extrahepatic biliary duct
dilatation.

Liver:

No focal lesion identified. Liver echogenicity is overall increased.
Portal vein is patent on color Doppler imaging with normal direction
of blood flow towards the liver.

Other: None.
IMPRESSION: 1. Cholelithiasis. No gallbladder wall thickening or pericholecystic
fluid.

2. Increased liver echogenicity, a finding indicative of hepatic
steatosis. While no focal liver lesions are evident on this study,
it must be cautioned that the sensitivity of ultrasound for
detection of focal liver lesions is diminished in this circumstance.

## 2022-02-13 ENCOUNTER — Encounter: Payer: Self-pay | Admitting: Family Medicine

## 2022-02-15 ENCOUNTER — Telehealth: Payer: Self-pay | Admitting: Family Medicine

## 2022-02-15 NOTE — Telephone Encounter (Signed)
Okay to schedule apt with Dr Reece Agar , needs to be a 30 mins slot next available is fine

## 2022-02-15 NOTE — Telephone Encounter (Signed)
Pt requested to switch PCP's.  Would like to re-est care with Dr Nadine Counts if possible. Per conversation with Dr Nadine Counts, she is in agreement to see pt as her pcp.  Dr Darlyn Read approve of this?

## 2022-02-15 NOTE — Telephone Encounter (Signed)
Ok with me. Please make sure she has 81m

## 2022-02-15 NOTE — Telephone Encounter (Signed)
Okay with me. ?WS ?

## 2022-02-15 NOTE — Telephone Encounter (Signed)
Appt scheduled with DR. G.

## 2022-02-25 ENCOUNTER — Ambulatory Visit: Payer: BC Managed Care – PPO | Admitting: Family Medicine

## 2022-02-25 ENCOUNTER — Encounter: Payer: Self-pay | Admitting: Family Medicine

## 2022-02-25 DIAGNOSIS — R7301 Impaired fasting glucose: Secondary | ICD-10-CM

## 2022-02-25 DIAGNOSIS — M25551 Pain in right hip: Secondary | ICD-10-CM

## 2022-02-25 MED ORDER — SAXENDA 18 MG/3ML ~~LOC~~ SOPN: PEN_INJECTOR | SUBCUTANEOUS | 0 refills | Status: AC

## 2022-02-25 MED ORDER — NOVOFINE PLUS PEN NEEDLE 32G X 4 MM MISC: 1 refills | Status: DC

## 2022-02-25 NOTE — Patient Instructions (Signed)
Tips for success with Saxenda (and by success, how not to be super sick on your stomach): Eat small meals AVOID heavy foods (fried/ high in carbs like bread, pasta, rice) AVOID carbonated beverages (soda/ beer, as these can increase bloating) DOUBLE your water intake (will help you avoid constipation/ dehydration)  Saxenda CAN cause: Nausea Abdominal pain Increased acid reflux (sometimes presents as "sour burps") Constipation OR Diarrhea Fatigue (especially when you first start it)  

## 2022-02-25 NOTE — Progress Notes (Signed)
Subjective: CC: Weight loss counseling PCP: Mechele Claude, MD Anna Richardson is a 46 y.o. female presenting to clinic today for:  1.  Obesity with associated impaired fasting glucose and hip pain Patient has not had cholesterol drawn in some time but she did have elevation in her blood sugar in July of last year.  She notes right-sided hip pain.  No knee pain.  She has been on multiple modalities of treatment for obesity in the past including weight watchers 3-4 times where she only lost about 10 pounds but always regained it back.  She has been on Atkins diet in her 50s but again this was not sustainable nor did she keep the weight off.  She has been on phentermine which did give her about a 20 to 25 pound weight loss but she regained this right back when she discontinued it.  She has tried calorie counting and lifestyle modification but has never been able to maintain these.  She has been interested in the GLP class for treatment of obesity.  Denies any personal history of pancreatitis nor any family or personal history of multiple endocrine type II neoplasia or medullary thyroid cancers.  There is a family history of Graves' disease in her mother.  She was personally screened for thyroid disorder and this was negative last year.  Current eating habits are not restrictive.  She admits to eating large portions and unrestricted calories.  She has binge eating behaviors in which she will eat past the point of fullness, snack after meals and ultimately feel guilty about these behaviors.  She does not snore and has never been diagnosed with sleep apnea.  She is treated with Celexa for anxiety disorder.  No reports of depressive symptoms.  She is sexually active and uses a diaphragm for contraception.  Last menstrual cycle was 02/16/2022.  She notes that periods are irregular and she believes herself to be in the perimenopausal state.  Her mother apparently went through menopause around her  age   ROS: Per HPI  Allergies  Allergen Reactions   Ciprofloxacin Swelling    EYE DROPS - Throat swelling   Past Medical History:  Diagnosis Date   Anxiety    situational   Family history of adverse reaction to anesthesia    N&V   Stutter     Current Outpatient Medications:    citalopram (CELEXA) 40 MG tablet, Take 1 tablet (40 mg total) by mouth daily., Disp: 90 tablet, Rfl: 2 Social History   Socioeconomic History   Marital status: Married    Spouse name: Not on file   Number of children: Not on file   Years of education: Not on file   Highest education level: Not on file  Occupational History   Not on file  Tobacco Use   Smoking status: Every Day    Packs/day: 0.50    Years: 10.00    Pack years: 5.00    Types: Cigarettes   Smokeless tobacco: Never  Vaping Use   Vaping Use: Never used  Substance and Sexual Activity   Alcohol use: Yes    Alcohol/week: 2.0 standard drinks    Types: 2 Cans of beer per week   Drug use: No   Sexual activity: Yes  Other Topics Concern   Not on file  Social History Narrative   Not on file   Social Determinants of Health   Financial Resource Strain: Not on file  Food Insecurity: Not on file  Transportation Needs:  Not on file  Physical Activity: Not on file  Stress: Not on file  Social Connections: Not on file  Intimate Partner Violence: Not on file   Family History  Problem Relation Age of Onset   Cancer Mother 29       breast     Objective: Office vital signs reviewed. BP 136/71   Pulse 78   Temp (!) 97.2 F (36.2 C)   Ht 5\' 4"  (1.626 m)   Wt 221 lb 6.4 oz (100.4 kg)   SpO2 96%   BMI 38.00 kg/m   Physical Examination:  General: Awake, alert, morbidly obese, No acute distress HEENT: No exophthalmos.  No goiter. Cardio: regular rate and rhythm, S1S2 heard, no murmurs appreciated Pulm: clear to auscultation bilaterally, no wheezes, rhonchi or rales; normal work of breathing on room air MSK: Ambulating  independently  Assessment/ Plan: 46 y.o. female   Morbid obesity (HCC) - Plan: Liraglutide -Weight Management (SAXENDA) 18 MG/3ML SOPN, Insulin Pen Needle (NOVOFINE PLUS PEN NEEDLE) 32G X 4 MM MISC  Impaired fasting glucose - Plan: Liraglutide -Weight Management (SAXENDA) 18 MG/3ML SOPN, Insulin Pen Needle (NOVOFINE PLUS PEN NEEDLE) 32G X 4 MM MISC  Pain of right hip - Plan: Liraglutide -Weight Management (SAXENDA) 18 MG/3ML SOPN, Insulin Pen Needle (NOVOFINE PLUS PEN NEEDLE) 32G X 4 MM MISC  She certainly seems like she qualifies for this medication given obesity, impaired fasting glucose and what sounds like some degenerative changes in the hip probably exacerbated by weight.  She has failed multiple modalities of treatment including lifestyle modification, weight watchers, Atkins diet and phentermine, always regaining the weight.  I would like to check a fasting glucose and lipid panel at next visit so I advised her to be fasting for her 43-month follow-up.  No apparent contraindications to use of the GLP.  Because 2-month is backordered we will proceed with Saxenda.  Discussed how to utilize this medication, how to inject and potential side effects.  We discussed red flag signs and symptoms which would warrant further evaluation.  Handout provided  No orders of the defined types were placed in this encounter.  No orders of the defined types were placed in this encounter.    Reginal Lutes, DO [facility] [phone]

## 2022-03-02 ENCOUNTER — Encounter: Payer: Self-pay | Admitting: Family Medicine

## 2022-03-02 ENCOUNTER — Telehealth: Payer: Self-pay | Admitting: *Deleted

## 2022-03-02 NOTE — Telephone Encounter (Signed)
Error

## 2022-03-02 NOTE — Telephone Encounter (Signed)
Approved  Faxed approval letter to patients pharmacy

## 2022-03-02 NOTE — Telephone Encounter (Signed)
Garth Schlatter ([facility]) Rx #: [id number] Saxenda 18MG /3ML pen-injectors  Sent to Plan

## 2022-04-22 ENCOUNTER — Encounter: Payer: Self-pay | Admitting: Family Medicine

## 2022-04-22 ENCOUNTER — Other Ambulatory Visit: Payer: Self-pay | Admitting: Family Medicine

## 2022-04-22 DIAGNOSIS — K219 Gastro-esophageal reflux disease without esophagitis: Secondary | ICD-10-CM

## 2022-04-22 MED ORDER — FAMOTIDINE 20 MG PO TABS: 20.0000 mg | ORAL_TABLET | Freq: Two times a day (BID) | ORAL | 3 refills | Status: DC | PRN

## 2022-05-31 ENCOUNTER — Ambulatory Visit: Payer: BC Managed Care – PPO | Admitting: Family Medicine

## 2022-06-01 ENCOUNTER — Ambulatory Visit: Payer: BC Managed Care – PPO | Admitting: Family Medicine

## 2022-06-01 ENCOUNTER — Encounter: Payer: Self-pay | Admitting: Family Medicine

## 2022-06-01 DIAGNOSIS — M25551 Pain in right hip: Secondary | ICD-10-CM | POA: Diagnosis not present

## 2022-06-01 DIAGNOSIS — R7301 Impaired fasting glucose: Secondary | ICD-10-CM | POA: Diagnosis not present

## 2022-06-01 LAB — CMP14+EGFR
ALT: 23 IU/L (ref 0–32)
AST: 20 IU/L (ref 0–40)
Albumin/Globulin Ratio: 1.5 (ref 1.2–2.2)
Albumin: 4.1 g/dL (ref 3.9–4.9)
Alkaline Phosphatase: 101 IU/L (ref 44–121)
BUN/Creatinine Ratio: 18 (ref 9–23)
BUN: 14 mg/dL (ref 6–24)
Bilirubin Total: 0.2 mg/dL (ref 0.0–1.2)
CO2: 20 mmol/L (ref 20–29)
Calcium: 8.8 mg/dL (ref 8.7–10.2)
Chloride: 105 mmol/L (ref 96–106)
Creatinine, Ser: 0.76 mg/dL (ref 0.57–1.00)
Globulin, Total: 2.8 g/dL (ref 1.5–4.5)
Glucose: 103 mg/dL — ABNORMAL HIGH (ref 70–99)
Potassium: 4.9 mmol/L (ref 3.5–5.2)
Sodium: 137 mmol/L (ref 134–144)
Total Protein: 6.9 g/dL (ref 6.0–8.5)
eGFR: 98 mL/min/{1.73_m2} (ref >59)

## 2022-06-01 LAB — LIPID PANEL
Chol/HDL Ratio: 3.1 ratio (ref 0.0–4.4)
Cholesterol, Total: 141 mg/dL (ref 100–199)
HDL: 45 mg/dL (ref >39)
LDL Chol Calc (NIH): 84 mg/dL (ref 0–99)
Triglycerides: 57 mg/dL (ref 0–149)
VLDL Cholesterol Cal: 12 mg/dL (ref 5–40)

## 2022-06-01 LAB — BAYER DCA HB A1C WAIVED: HB A1C (BAYER DCA - WAIVED): 5 % (ref 4.8–5.6)

## 2022-06-01 MED ORDER — NOVOFINE PLUS PEN NEEDLE 32G X 4 MM MISC: 1 refills | Status: DC

## 2022-06-01 MED ORDER — SAXENDA 18 MG/3ML ~~LOC~~ SOPN: 3.0000 mg | PEN_INJECTOR | Freq: Every day | SUBCUTANEOUS | 3 refills | Status: DC

## 2022-06-01 NOTE — Progress Notes (Signed)
Subjective: PQ:ZRAQTMA PCP: Anna Norlander, DO UQJ:[id number] Anna Richardson is Anna 46 y.o. female presenting to clinic today for:  1.  Morbid obesity Patient was started on Saxenda in June.  She did have some increased acid reflux following initiation of the medication and was prescribed Pepcid.  She reports today that she has been doing well with weight loss.  She denies any nausea, vomiting, diarrhea or constipation.  No abdominal pain.  She does suffer from some acid reflux and does get Anna little breakthrough reflux despite use of Pepcid.  She admits though that she is not using it twice daily regularly.  She has modified her diet to avoid these acid reflux exacerbations and this has been helpful.  She is hydrating well.  Exercising minimally and she has plateaued on weight loss so this is expected to be added to Anna higher degree soon.  Energy is better.  Sleep is good.  Hip pain has gotten better since weight loss   ROS: Per HPI  Allergies  Allergen Reactions   Ciprofloxacin Swelling    EYE DROPS - Throat swelling   Past Medical History:  Diagnosis Date   Anxiety    situational   Family history of adverse reaction to anesthesia    N&V   Stutter     Current Outpatient Medications:    citalopram (CELEXA) 40 MG tablet, Take 1 tablet (40 mg total) by mouth daily., Disp: 90 tablet, Rfl: 2   famotidine (PEPCID) 20 MG tablet, Take 1 tablet (20 mg total) by mouth 2 (two) times daily as needed for heartburn or indigestion., Disp: 180 tablet, Rfl: 3   Insulin Pen Needle (NOVOFINE PLUS PEN NEEDLE) 32G X 4 MM MISC, UAD with saxenda, Disp: 100 each, Rfl: 1 Social History   Socioeconomic History   Marital status: Married    Spouse name: Not on file   Number of children: Not on file   Years of education: Not on file   Highest education level: Not on file  Occupational History   Not on file  Tobacco Use   Smoking status: Every Day    Packs/day: 0.50    Years: 10.00    Total pack years:  5.00    Types: Cigarettes   Smokeless tobacco: Never  Vaping Use   Vaping Use: Never used  Substance and Sexual Activity   Alcohol use: Yes    Alcohol/week: 2.0 standard drinks of alcohol    Types: 2 Cans of beer per week   Drug use: No   Sexual activity: Yes  Other Topics Concern   Not on file  Social History Narrative   Not on file   Social Determinants of Health   Financial Resource Strain: Not on file  Food Insecurity: Not on file  Transportation Needs: Not on file  Physical Activity: Not on file  Stress: Not on file  Social Connections: Not on file  Intimate Partner Violence: Not on file   Family History  Problem Relation Age of Onset   Cancer Mother 65       breast     Objective: Office vital signs reviewed. BP 117/83   Pulse 78   Temp 97.7 F (36.5 C) (Temporal)   Resp 20   Ht 5' 4"  (1.626 m)   Wt 203 lb (92.1 kg)   SpO2 93%   BMI 34.84 kg/m   Physical Examination:  General: Awake, alert, well nourished, No acute distress HEENT: Sclera white.  Moist mucous membranes Cardio:  regular rate and rhythm, S1S2 heard, no murmurs appreciated Pulm: clear to auscultation bilaterally, no wheezes, rhonchi or rales; normal work of breathing on room air GI: soft, non-tender, non-distended, bowel sounds present x4, no hepatomegaly, no splenomegaly, no masses Extremities: warm, well perfused, No edema, cyanosis or clubbing; +2 pulses bilaterally MSK: Normal gait and station  Assessment/ Plan: 46 y.o. female   Morbid obesity ([place]) - Plan: CMP14+EGFR, Bayer DCA Hb A1c Waived, Lipid Panel, SAXENDA 18 MG/3ML SOPN, Insulin Pen Needle (NOVOFINE PLUS PEN NEEDLE) 32G X 4 MM MISC  Impaired fasting glucose - Plan: Bayer DCA Hb A1c Waived, SAXENDA 18 MG/3ML SOPN, Insulin Pen Needle (NOVOFINE PLUS PEN NEEDLE) 32G X 4 MM MISC  Pain of right hip - Plan: Insulin Pen Needle (NOVOFINE PLUS PEN NEEDLE) 32G X 4 MM MISC  Doing extremely well.  She has had almost Anna 20 pound weight  loss since initiation in June.  Meeting goals.  Incorporate more exercise advised.  We will follow-up again after the new year and may be able to consider switching her over to once weekly regimen with [facility] or alternative depending on supply.  She is amenable to this plan.  Check lipid, sugar, metabolic panel.  Refills on Saxenda have been sent  No orders of the defined types were placed in this encounter.  No orders of the defined types were placed in this encounter.    Anna Norlander, DO [place] [phone]

## 2022-06-01 NOTE — Patient Instructions (Signed)
You're doing awesome! We can certainly plan for a transition to [facility] in January assuming it's easily available to you. Incorporate a little more exercise and I think this will restart the weight loss.

## 2022-06-06 ENCOUNTER — Encounter: Payer: Self-pay | Admitting: Family Medicine

## 2022-07-08 ENCOUNTER — Encounter: Payer: Self-pay | Admitting: Family Medicine

## 2022-07-08 DIAGNOSIS — F411 Generalized anxiety disorder: Secondary | ICD-10-CM

## 2022-07-08 MED ORDER — CITALOPRAM HYDROBROMIDE 40 MG PO TABS: 40.0000 mg | ORAL_TABLET | Freq: Every day | ORAL | 2 refills | Status: DC

## 2022-07-12 ENCOUNTER — Telehealth: Payer: Self-pay

## 2022-07-12 ENCOUNTER — Encounter: Payer: Self-pay | Admitting: Family Medicine

## 2022-07-12 NOTE — Telephone Encounter (Signed)
LUDMILA EBARB (Key: [id number]) Rx #: [id number] Saxenda 18MG Fayne Mediate pen-injectors   Form [employer] Electronic PA Form [phone] NCPDP) Created 7 days ago Sent to Plan 3 hours ago Plan Response 3 hours ago Submit Clinical Questions 3 hours ago Determination Unfavorable 1 hour ago Your prior authorization for Kirke Shaggy has been denied. RETURN TO DASHBOARD When applicable, information about how to complete an appeal for this patient will be sent to you. Please also see the determination letter provided by the payer/PBM for more information.  Message from plan: Your PA request has been denied. Additional information will be provided in the denial communication. (Message 1140)

## 2022-07-22 ENCOUNTER — Telehealth (INDEPENDENT_AMBULATORY_CARE_PROVIDER_SITE_OTHER): Payer: BC Managed Care – PPO | Admitting: Family Medicine

## 2022-07-22 DIAGNOSIS — J069 Acute upper respiratory infection, unspecified: Secondary | ICD-10-CM

## 2022-07-22 DIAGNOSIS — Z716 Tobacco abuse counseling: Secondary | ICD-10-CM

## 2022-07-22 MED ORDER — BENZONATATE 100 MG PO CAPS: 100.0000 mg | ORAL_CAPSULE | Freq: Three times a day (TID) | ORAL | 0 refills | Status: DC | PRN

## 2022-07-22 MED ORDER — PROMETHAZINE-DM 6.25-15 MG/5ML PO SYRP: 2.5000 mL | ORAL_SOLUTION | Freq: Four times a day (QID) | ORAL | 0 refills | Status: DC | PRN

## 2022-07-22 NOTE — Progress Notes (Signed)
Telephone visit  Subjective: CC: tobacco use PCP: Janora Norlander, DO ZOX:[id number] A Anna Richardson is a 46 y.o. female calls for telephone consult today. Patient provides verbal consent for consult held via phone.  Due to COVID-19 pandemic this visit was conducted virtually. This visit type was conducted due to national recommendations for restrictions regarding the COVID-19 Pandemic (e.g. social distancing, sheltering in place) in an effort to limit this patient's exposure and mitigate transmission in our community. All issues noted in this document were discussed and addressed.  A physical exam was not performed with this format.   Location of patient: home Location of provider: [facility] Others present for call: none  1. Tobacco use Patient is currently smoking 0.5ppd. She has been a smoker on and off for 20 years.  She has never smoked more than 0.5ppd.  No SOB, Wheezing, night sweats, hemoptysis or unplanned weight loss. She has tried quit cold [place] in the past several times and this lasted a few months.  Never used patches, gum, chantix.    2. Sick Patient reports onset of illness 3 days ago.  Having cough, congestion, rhinorrhea.  No fevers.  Slightly worse than onset.  No myalgia, nausea, vomiting or diarrhea.  Has had other coworkers that have been ill.  No covid.  Had negative self COVID test.    ROS: Per HPI  Allergies  Allergen Reactions   Ciprofloxacin Swelling    EYE DROPS - Throat swelling   Past Medical History:  Diagnosis Date   Anxiety    situational   Family history of adverse reaction to anesthesia    N&V   Stutter     Current Outpatient Medications:    citalopram (CELEXA) 40 MG tablet, Take 1 tablet (40 mg total) by mouth daily., Disp: 90 tablet, Rfl: 2   famotidine (PEPCID) 20 MG tablet, Take 1 tablet (20 mg total) by mouth 2 (two) times daily as needed for heartburn or indigestion., Disp: 180 tablet, Rfl: 3   Insulin Pen Needle (NOVOFINE PLUS PEN NEEDLE) 32G X 4  MM MISC, UAD with saxenda, Disp: 100 each, Rfl: 1   SAXENDA 18 MG/3ML SOPN, Inject 3 mg into the skin daily., Disp: 45 mL, Rfl: 3  Assessment/ Plan: 46 y.o. female   Tobacco abuse counseling  Viral URI with cough - Plan: benzonatate (TESSALON PERLES) 100 MG capsule, promethazine-dextromethorphan (PROMETHAZINE-DM) 6.25-15 MG/5ML syrup  She is in action phase of quitting on her own.  Does not desire patches, gum or oral medication at this time but understands they are available should she change her mind.  Quit date goal: 09/25/2022.  Start time: 12:27pm End time: 12:38pm  Total time spent on patient care (including telephone call/ virtual visit): 11 minutes  [place], Kinder [phone]

## 2022-07-22 NOTE — Patient Instructions (Signed)

## 2022-07-28 ENCOUNTER — Encounter: Payer: Self-pay | Admitting: Family Medicine

## 2022-08-24 ENCOUNTER — Encounter: Payer: Self-pay | Admitting: Family Medicine

## 2022-08-29 ENCOUNTER — Telehealth (INDEPENDENT_AMBULATORY_CARE_PROVIDER_SITE_OTHER): Payer: BC Managed Care – PPO | Admitting: Family Medicine

## 2022-08-29 DIAGNOSIS — L710 Perioral dermatitis: Secondary | ICD-10-CM | POA: Diagnosis not present

## 2022-08-29 MED ORDER — FLUCONAZOLE 150 MG PO TABS: 150.0000 mg | ORAL_TABLET | Freq: Once | ORAL | 0 refills | Status: AC

## 2022-08-29 MED ORDER — DOXYCYCLINE HYCLATE 100 MG PO TABS: 100.0000 mg | ORAL_TABLET | Freq: Two times a day (BID) | ORAL | 0 refills | Status: AC

## 2022-08-29 NOTE — Progress Notes (Signed)
MyChart Video visit  Subjective: JX:BJYNWGNF dermatitis PCP: Raliegh Ip, DO AOZ:[id number] A Schlauch is a 46 y.o. female. Patient provides verbal consent for consult held via video.  Due to COVID-19 pandemic this visit was conducted virtually. This visit type was conducted due to national recommendations for restrictions regarding the COVID-19 Pandemic (e.g. social distancing, sheltering in place) in an effort to limit this patient's exposure and mitigate transmission in our community. All issues noted in this document were discussed and addressed.  A physical exam was not performed with this format.   Location of patient: home Location of provider: [facility] Others present for call: none  1. Dermatitis Patient notes onset of dermatitis around her mouth few days ago.  She has been using Tacrolimus.  Denies any oral irritation/ throat swelling.  No known triggers.  No pustules.  Friend in dermatology and gave her the tacrolimus.   ROS: Per HPI  Allergies  Allergen Reactions   Ciprofloxacin Swelling    EYE DROPS - Throat swelling   Past Medical History:  Diagnosis Date   Anxiety    situational   Family history of adverse reaction to anesthesia    N&V   Stutter     Current Outpatient Medications:    benzonatate (TESSALON PERLES) 100 MG capsule, Take 1 capsule (100 mg total) by mouth 3 (three) times daily as needed for cough., Disp: 20 capsule, Rfl: 0   citalopram (CELEXA) 40 MG tablet, Take 1 tablet (40 mg total) by mouth daily., Disp: 90 tablet, Rfl: 2   famotidine (PEPCID) 20 MG tablet, Take 1 tablet (20 mg total) by mouth 2 (two) times daily as needed for heartburn or indigestion., Disp: 180 tablet, Rfl: 3   Insulin Pen Needle (NOVOFINE PLUS PEN NEEDLE) 32G X 4 MM MISC, UAD with saxenda, Disp: 100 each, Rfl: 1   promethazine-dextromethorphan (PROMETHAZINE-DM) 6.25-15 MG/5ML syrup, Take 2.5 mLs by mouth 4 (four) times daily as needed for cough., Disp: 118 mL, Rfl: 0   SAXENDA  18 MG/3ML SOPN, Inject 3 mg into the skin daily., Disp: 45 mL, Rfl: 3  Gen: Nontoxic-appearing female Skin: Mild perioral erythema not involving the corners of the mouth  Assessment/ Plan: 46 y.o. female   Perioral dermatitis - Plan: doxycycline (VIBRA-TABS) 100 MG tablet, fluconazole (DIFLUCAN) 150 MG tablet  Appears to be a perioral dermatitis.  Does not appear to be angular cheilitis as it does not involve the corners of the mouth.  I have given her Diflucan and doxycycline to have on hand if needed but I would actually try a very light amount of topical hydrocortisone up to twice daily if needed for itching and inflammation.  We discussed that if this exacerbates symptoms she is to discontinue it.  Uncertain if the tacrolimus will be especially helpful but if this is in fact some type of facial folliculitis or rosacea this should in fact help her symptoms  Start time: 3:22pm End time: 3:29p  Total time spent on patient care (including video visit/ documentation): 7 minutes  Chevon Fomby Hulen Skains, DO [facility] [phone]

## 2022-08-29 NOTE — Patient Instructions (Signed)
Perioral dermatitis  Perioral dermatitis is an eruption which is usually located around the mouth and nose.  It can be a rash and/or red bumps.  It occasionally occurs around the eyes.  It may be itchy and may burn.  The exact cause is unknown.  Some types of makeup, moisturizers, dental products, and prescription creams may be partially responsible for the eruption.  Topical steroids such as cortisone creams can temporarily make the rash better but with discontinuation the rash tends to recur and worsen.  If you have been using topical steroids, your dermatologist may need to gradually taper the strength of steroids.  Topical antibiotics, elidel cream, protopic ointment, and oral antiobiotics may be prescribed to treat this condition.  Although perioral dermatitis is not an infection, some antibiotics have anti-inflammatory properties that help it greatly.  

## 2022-08-30 ENCOUNTER — Telehealth: Payer: BC Managed Care – PPO

## 2022-10-03 ENCOUNTER — Ambulatory Visit: Payer: BC Managed Care – PPO | Admitting: Family Medicine

## 2022-10-07 ENCOUNTER — Telehealth (INDEPENDENT_AMBULATORY_CARE_PROVIDER_SITE_OTHER): Payer: BC Managed Care – PPO | Admitting: Family

## 2022-10-07 ENCOUNTER — Encounter: Payer: Self-pay | Admitting: Family

## 2022-10-07 DIAGNOSIS — R059 Cough, unspecified: Secondary | ICD-10-CM

## 2022-10-07 DIAGNOSIS — R6889 Other general symptoms and signs: Secondary | ICD-10-CM | POA: Diagnosis not present

## 2022-10-07 MED ORDER — OSELTAMIVIR PHOSPHATE 75 MG PO CAPS: 75.0000 mg | ORAL_CAPSULE | Freq: Two times a day (BID) | ORAL | 0 refills | Status: DC

## 2022-10-07 NOTE — Progress Notes (Signed)
Virtual Visit Consent   Anna Richardson, you are scheduled for a virtual visit with a [facility] provider today. Just as with appointments in the office, your consent must be obtained to participate. Your consent will be active for this visit and any virtual visit you may have with one of our providers in the next 365 days. If you have a MyChart account, a copy of this consent can be sent to you electronically.  As this is a virtual visit, video technology does not allow for your provider to perform a traditional examination. This may limit your provider's ability to fully assess your condition. If your provider identifies any concerns that need to be evaluated in person or the need to arrange testing (such as labs, EKG, etc.), we will make arrangements to do so. Although advances in technology are sophisticated, we cannot ensure that it will always work on either your end or our end. If the connection with a video visit is poor, the visit may have to be switched to a telephone visit. With either a video or telephone visit, we are not always able to ensure that we have a secure connection.  By engaging in this virtual visit, you consent to the provision of healthcare and authorize for your insurance to be billed (if applicable) for the services provided during this visit. Depending on your insurance coverage, you may receive a charge related to this service.  I need to obtain your verbal consent now. Are you willing to proceed with your visit today? Anna Richardson has provided verbal consent on 10/07/2022 for a virtual visit (video or telephone). Anna Dun, FNP  Date: 10/07/2022 10:10 AM  Virtual Visit via Video Note   I, Anna Richardson, connected with  Anna Richardson  ([id number], 1975/10/01) on 10/07/22 at  6:15 PM EST by a video-enabled telemedicine application and verified that I am speaking with the correct person using two identifiers.  Location: Patient: Virtual Visit Location  Patient: Home Provider: Virtual Visit Location Provider: Office/Clinic   I discussed the limitations of evaluation and management by telemedicine and the availability of in person appointments. The patient expressed understanding and agreed to proceed.    History of Present Illness: Anna Richardson is a 47 y.o. who identifies as a female who was assigned female at birth, and is being seen today for flu like symptoms that started .  HPI: Cough This is a new problem. The current episode started in the past 7 days. The problem has been unchanged. The problem occurs every few minutes. The cough is Productive of sputum. Associated symptoms include chills, ear pain, a fever (low grade), headaches, myalgias, nasal congestion and rhinorrhea. Pertinent negatives include no ear congestion, sore throat, shortness of breath or wheezing. She has tried OTC cough suppressant for the symptoms. The treatment provided mild relief.    Problems:  Patient Active Problem List   Diagnosis Date Noted   Annual physical exam 08/10/2016   Generalized anxiety disorder 08/15/2015   Tobacco abuse 08/15/2015   BMI 35.0-35.9,adult 08/15/2015   Left lower quadrant pain 08/15/2015   Hirsutism 08/15/2015    Allergies:  Allergies  Allergen Reactions   Ciprofloxacin Swelling    EYE DROPS - Throat swelling   Medications:  Current Outpatient Medications:    oseltamivir (TAMIFLU) 75 MG capsule, Take 1 capsule (75 mg total) by mouth 2 (two) times daily., Disp: 10 capsule, Rfl: 0   citalopram (CELEXA) 40 MG tablet, Take 1 tablet (40 mg  total) by mouth daily., Disp: 90 tablet, Rfl: 2   famotidine (PEPCID) 20 MG tablet, Take 1 tablet (20 mg total) by mouth 2 (two) times daily as needed for heartburn or indigestion., Disp: 180 tablet, Rfl: 3   Insulin Pen Needle (NOVOFINE PLUS PEN NEEDLE) 32G X 4 MM MISC, UAD with saxenda, Disp: 100 each, Rfl: 1   SAXENDA 18 MG/3ML SOPN, Inject 3 mg into the skin daily., Disp: 45 mL, Rfl:  3  Observations/Objective: Patient is well-developed, well-nourished in no acute distress.  Resting comfortably  at home.  Head is normocephalic, atraumatic.  No labored breathing.  Speech is clear and coherent with logical content.  Patient is alert and oriented at baseline.  Nasal congestion   Assessment and Plan: 1. Flu-like symptoms - oseltamivir (TAMIFLU) 75 MG capsule; Take 1 capsule (75 mg total) by mouth 2 (two) times daily.  Dispense: 10 capsule; Refill: 0  Rest Force fluids  Tylenol  Droplet precautions  Follow up if symptoms worsen or do not improve  Follow Up Instructions: I discussed the assessment and treatment plan with the patient. The patient was provided an opportunity to ask questions and all were answered. The patient agreed with the plan and demonstrated an understanding of the instructions.  A copy of instructions were sent to the patient via MyChart unless otherwise noted below.     The patient was advised to call back or seek an in-person evaluation if the symptoms worsen or if the condition fails to improve as anticipated.  Time:  I spent 6 minutes with the patient via telehealth technology discussing the above problems/concerns.    Anna Dun, FNP

## 2022-10-17 ENCOUNTER — Telehealth: Payer: BC Managed Care – PPO | Admitting: Nurse Practitioner

## 2022-10-17 DIAGNOSIS — J40 Bronchitis, not specified as acute or chronic: Secondary | ICD-10-CM | POA: Diagnosis not present

## 2022-10-17 MED ORDER — ALBUTEROL SULFATE HFA 108 (90 BASE) MCG/ACT IN AERS: 2.0000 | INHALATION_SPRAY | Freq: Four times a day (QID) | RESPIRATORY_TRACT | 0 refills | Status: AC | PRN

## 2022-10-17 MED ORDER — AZITHROMYCIN 250 MG PO TABS: ORAL_TABLET | ORAL | 0 refills | Status: AC

## 2022-10-17 MED ORDER — PREDNISONE 20 MG PO TABS: 20.0000 mg | ORAL_TABLET | Freq: Two times a day (BID) | ORAL | 0 refills | Status: AC

## 2022-10-17 NOTE — Progress Notes (Signed)
We are sorry that you are not feeling well.  Here is how we plan to help!  Based on your presentation I believe you most likely have A cough due to bacteria.  When patients have a fever and a productive cough with a change in color or increased sputum production, we are concerned about bacterial bronchitis.  If left untreated it can progress to pneumonia.  If your symptoms do not improve with your treatment plan it is important that you contact your provider.   I have prescribed Azithromyin 250 mg: two tablets now and then one tablet daily for 4 additonal days    In addition you may use A non-prescription cough medication called Mucinex DM: take 2 tablets every 12 hours.  Prednisone 20 mg twice daily for 5 days  We will also refill your Albuterol inhaler to assure you have enough to use throughout this illness  Meds ordered this encounter  Medications   azithromycin (ZITHROMAX) 250 MG tablet    Sig: Take 2 tablets on day 1, then 1 tablet daily on days 2 through 5    Dispense:  6 tablet    Refill:  0   albuterol (VENTOLIN HFA) 108 (90 Base) MCG/ACT inhaler    Sig: Inhale 2 puffs into the lungs every 6 (six) hours as needed for wheezing or shortness of breath.    Dispense:  8 g    Refill:  0   predniSONE (DELTASONE) 20 MG tablet    Sig: Take 1 tablet (20 mg total) by mouth 2 (two) times daily with a meal for 5 days.    Dispense:  10 tablet    Refill:  0     From your responses in the eVisit questionnaire you describe inflammation in the upper respiratory tract which is causing a significant cough.  This is commonly called Bronchitis and has four common causes:      USE OF BRONCHODILATOR ("RESCUE") INHALERS: There is a risk from using your bronchodilator too frequently.  The risk is that over-reliance on a medication which only relaxes the muscles surrounding the breathing tubes can reduce the effectiveness of medications prescribed to reduce swelling and congestion of the tubes  themselves.  Although you feel brief relief from the bronchodilator inhaler, your asthma may actually be worsening with the tubes becoming more swollen and filled with mucus.  This can delay other crucial treatments, such as oral steroid medications. If you need to use a bronchodilator inhaler daily, several times per day, you should discuss this with your provider.  There are probably better treatments that could be used to keep your asthma under control.     HOME CARE Only take medications as instructed by your medical team. Complete the entire course of an antibiotic. Drink plenty of fluids and get plenty of rest. Avoid close contacts especially the very young and the elderly Cover your mouth if you cough or cough into your sleeve. Always remember to wash your hands A steam or ultrasonic humidifier can help congestion.   GET HELP RIGHT AWAY IF: You develop worsening fever. You become short of breath You cough up blood. Your symptoms persist after you have completed your treatment plan MAKE SURE YOU  Understand these instructions. Will watch your condition. Will get help right away if you are not doing well or get worse. I spent approximately 5 minutes reviewing the patient's history, current symptoms and coordinating their care today.

## 2022-11-30 ENCOUNTER — Encounter: Payer: Self-pay | Admitting: Family Medicine

## 2023-04-30 LAB — COLOGUARD: COLOGUARD: NEGATIVE

## 2023-04-30 LAB — EXTERNAL GENERIC LAB PROCEDURE: COLOGUARD: NEGATIVE

## 2023-08-15 ENCOUNTER — Telehealth (INDEPENDENT_AMBULATORY_CARE_PROVIDER_SITE_OTHER): Payer: BC Managed Care – PPO | Admitting: Family Medicine

## 2023-08-15 ENCOUNTER — Encounter: Payer: Self-pay | Admitting: Family Medicine

## 2023-08-15 VITALS — Wt 225.0 lb

## 2023-08-15 DIAGNOSIS — F17210 Nicotine dependence, cigarettes, uncomplicated: Secondary | ICD-10-CM

## 2023-08-15 DIAGNOSIS — F411 Generalized anxiety disorder: Secondary | ICD-10-CM | POA: Diagnosis not present

## 2023-08-15 DIAGNOSIS — R7301 Impaired fasting glucose: Secondary | ICD-10-CM

## 2023-08-15 DIAGNOSIS — Z720 Tobacco use: Secondary | ICD-10-CM

## 2023-08-15 MED ORDER — VARENICLINE TARTRATE (STARTER) 0.5 MG X 11 & 1 MG X 42 PO TBPK: ORAL_TABLET | ORAL | 0 refills | Status: DC

## 2023-08-15 MED ORDER — VARENICLINE TARTRATE 0.5 MG PO TABS: 0.5000 mg | ORAL_TABLET | Freq: Two times a day (BID) | ORAL | 1 refills | Status: DC

## 2023-08-15 MED ORDER — CITALOPRAM HYDROBROMIDE 40 MG PO TABS: 40.0000 mg | ORAL_TABLET | Freq: Every day | ORAL | 3 refills | Status: DC

## 2023-08-15 NOTE — Progress Notes (Signed)
MyChart Video visit  Subjective: VQ:QVZDGLO use disorder PCP: Raliegh Ip, DO VFI:EPPIRJJO Anna Richardson is Anna 47 y.o. female. Patient provides verbal consent for consult held via video.  Due to COVID-19 pandemic this visit was conducted virtually. This visit type was conducted due to national recommendations for restrictions regarding the COVID-19 Pandemic (e.g. social distancing, sheltering in place) in an effort to limit this patient's exposure and mitigate transmission in our community. All issues noted in this document were discussed and addressed.  Anna physical exam was not performed with this format.   Location of patient: work Location of provider: [facility] Others present for call: none  1. Tobacco use disorder She continues to smoke, currently smoking 12 cigarettes per day.  She smokes marlboro lights.  She is ready for smoking cessation and would like to proceed with Chantix.  No history of seizure disorder.  She is currently treated with Celexa for generalized anxiety disorder.  She denies any hemoptysis, shortness of breath, wheezing, unplanned weight loss or night sweats.  2.  Obesity w/ impaired fasting glucose She was previously treated with Saxenda and this was promising but her insurance does not pay for it so she would like to try an alternative if possible.  Her last known weight was 225 pounds.   Last labs were drawn in September 2023 which showed mild elevation in blood sugar at 103 but no evidence of type 2 diabetes with A1c of 5.0.  Cholesterol was normal.    ROS: Per HPI  Allergies  Allergen Reactions   Ciprofloxacin Swelling    EYE DROPS - Throat swelling   Past Medical History:  Diagnosis Date   Anxiety    situational   Family history of adverse reaction to anesthesia    N&V   Stutter     Current Outpatient Medications:    albuterol (VENTOLIN HFA) 108 (90 Base) MCG/ACT inhaler, Inhale 2 puffs into the lungs every 6 (six) hours as needed for wheezing or  shortness of breath., Disp: 8 g, Rfl: 0   citalopram (CELEXA) 40 MG tablet, Take 1 tablet (40 mg total) by mouth daily., Disp: 90 tablet, Rfl: 2   famotidine (PEPCID) 20 MG tablet, Take 1 tablet (20 mg total) by mouth 2 (two) times daily as needed for heartburn or indigestion., Disp: 180 tablet, Rfl: 3  Weight 225 lb (102.1 kg), last menstrual period 08/06/2023.  Gen: well appearing obese female, NAD Pulm: normal WOB on room air, no wheezes or dyspnea with speech Neuro: stutter present  Assessment/ Plan: 47 y.o. female   Tobacco abuse - Plan: Varenicline Tartrate, Starter, (CHANTIX STARTING MONTH PAK) 0.5 MG X 11 & 1 MG X 42 TBPK, varenicline (CHANTIX) 0.5 MG tablet, CMP14+EGFR, CBC  Generalized anxiety disorder - Plan: citalopram (CELEXA) 40 MG tablet, CMP14+EGFR  Morbid obesity (HCC) - Plan: Lipid panel, TSH, VITAMIN D 25 Hydroxy (Vit-D Deficiency, Fractures), CMP14+EGFR  Impaired fasting glucose - Plan: Bayer DCA Hb A1c Waived, CMP14+EGFR  Will give note indicating that we had tobacco abuse discussion.  She is in the action phase of smoking cessation and we will start her on Chantix.  Discussed potential side effects.  No apparent contraindications to use.  Future fasting orders placed.  She is interested in starting compounded semaglutide, which I will send over to [facility] once her labs have resulted.  She will schedule lab appointment as well as annual physical exam.  Will need to perform ROI to get cervical cancer screening results from her  [facility]  Start time: 9:41a End time: 9:50a  Total time spent on patient care (including video visit/ documentation): 16 minutes  Iwao Shamblin Hulen Skains, DO [facility] [phone]

## 2023-08-18 ENCOUNTER — Other Ambulatory Visit: Payer: BC Managed Care – PPO

## 2023-08-18 DIAGNOSIS — R7301 Impaired fasting glucose: Secondary | ICD-10-CM

## 2023-08-18 DIAGNOSIS — Z720 Tobacco use: Secondary | ICD-10-CM

## 2023-08-18 DIAGNOSIS — F411 Generalized anxiety disorder: Secondary | ICD-10-CM

## 2023-08-18 LAB — BAYER DCA HB A1C WAIVED: HB A1C (BAYER DCA - WAIVED): 5.3 % (ref 4.8–5.6)

## 2023-08-19 LAB — LIPID PANEL
Chol/HDL Ratio: 3.2 ratio (ref 0.0–4.4)
Cholesterol, Total: 165 mg/dL (ref 100–199)
HDL: 51 mg/dL (ref >39)
LDL Chol Calc (NIH): 100 mg/dL — ABNORMAL HIGH (ref 0–99)
Triglycerides: 71 mg/dL (ref 0–149)
VLDL Cholesterol Cal: 14 mg/dL (ref 5–40)

## 2023-08-19 LAB — CBC
Hematocrit: 39.7 % (ref 34.0–46.6)
Hemoglobin: 13.1 g/dL (ref 11.1–15.9)
MCH: 32 pg (ref 26.6–33.0)
MCHC: 33 g/dL (ref 31.5–35.7)
MCV: 97 fL (ref 79–97)
Platelets: 357 10*3/uL (ref 150–450)
RBC: 4.1 x10E6/uL (ref 3.77–5.28)
RDW: 12.2 % (ref 11.7–15.4)
WBC: 7.2 10*3/uL (ref 3.4–10.8)

## 2023-08-19 LAB — CMP14+EGFR
ALT: 16 IU/L (ref 0–32)
AST: 16 IU/L (ref 0–40)
Albumin: 4.2 g/dL (ref 3.9–4.9)
Alkaline Phosphatase: 95 [IU]/L (ref 44–121)
BUN/Creatinine Ratio: 19 (ref 9–23)
BUN: 14 mg/dL (ref 6–24)
Bilirubin Total: 0.3 mg/dL (ref 0.0–1.2)
CO2: 21 mmol/L (ref 20–29)
Calcium: 9 mg/dL (ref 8.7–10.2)
Chloride: 102 mmol/L (ref 96–106)
Creatinine, Ser: 0.72 mg/dL (ref 0.57–1.00)
Globulin, Total: 2.5 g/dL (ref 1.5–4.5)
Glucose: 96 mg/dL (ref 70–99)
Potassium: 4.7 mmol/L (ref 3.5–5.2)
Sodium: 136 mmol/L (ref 134–144)
Total Protein: 6.7 g/dL (ref 6.0–8.5)
eGFR: 104 mL/min/{1.73_m2} (ref >59)

## 2023-08-19 LAB — VITAMIN D 25 HYDROXY (VIT D DEFICIENCY, FRACTURES): Vit D, 25-Hydroxy: 20.8 ng/mL — ABNORMAL LOW (ref 30.0–100.0)

## 2023-08-19 LAB — TSH: TSH: 1.3 u[IU]/mL (ref 0.450–4.500)

## 2023-08-21 ENCOUNTER — Other Ambulatory Visit: Payer: Self-pay | Admitting: Family Medicine

## 2023-08-21 DIAGNOSIS — E559 Vitamin D deficiency, unspecified: Secondary | ICD-10-CM

## 2023-08-21 MED ORDER — VITAMIN D (ERGOCALCIFEROL) 1.25 MG (50000 UNIT) PO CAPS: 50000.0000 [IU] | ORAL_CAPSULE | ORAL | 0 refills | Status: DC

## 2023-08-22 ENCOUNTER — Encounter: Payer: Self-pay | Admitting: Family Medicine

## 2023-08-28 NOTE — Telephone Encounter (Signed)
Kel[facility], can you please resend and call and confirm receipt?

## 2023-09-01 ENCOUNTER — Telehealth: Payer: Self-pay

## 2023-09-01 NOTE — Telephone Encounter (Signed)
3rd time calling [facility] [phone]   Gave Verbal for Rx to be filled for pt

## 2023-09-01 NOTE — Telephone Encounter (Signed)
Called and spoke with Lynden Ang at [facility] who confirmed that they have not received the compounded Semaglutide Rx that PCP wanted sent in for patient. Please try resending.

## 2023-09-18 ENCOUNTER — Telehealth: Payer: BC Managed Care – PPO | Admitting: Physician Assistant

## 2023-09-18 DIAGNOSIS — K047 Periapical abscess without sinus: Secondary | ICD-10-CM

## 2023-09-18 MED ORDER — AMOXICILLIN-POT CLAVULANATE 875-125 MG PO TABS: 1.0000 | ORAL_TABLET | Freq: Two times a day (BID) | ORAL | 0 refills | Status: DC

## 2023-09-18 MED ORDER — NAPROXEN 500 MG PO TABS: 500.0000 mg | ORAL_TABLET | Freq: Two times a day (BID) | ORAL | 0 refills | Status: DC

## 2023-09-18 NOTE — Progress Notes (Signed)

## 2023-09-18 NOTE — Progress Notes (Signed)
I have spent 5 minutes in review of e-visit questionnaire, review and updating patient chart, medical decision making and response to patient.   Mia Milan Cody Jacklynn Dehaas, PA-C    

## 2023-09-25 ENCOUNTER — Other Ambulatory Visit: Payer: Self-pay | Admitting: Family Medicine

## 2023-09-25 DIAGNOSIS — E559 Vitamin D deficiency, unspecified: Secondary | ICD-10-CM

## 2023-10-02 ENCOUNTER — Encounter: Payer: Self-pay | Admitting: Family Medicine

## 2023-11-03 ENCOUNTER — Ambulatory Visit: Payer: 59 | Admitting: Family

## 2023-11-03 ENCOUNTER — Encounter: Payer: Self-pay | Admitting: Family

## 2023-11-03 VITALS — BP 103/68 | HR 69 | Temp 98.3°F | Ht 64.0 in | Wt 203.8 lb

## 2023-11-03 DIAGNOSIS — M5442 Lumbago with sciatica, left side: Secondary | ICD-10-CM | POA: Diagnosis not present

## 2023-11-03 MED ORDER — BACLOFEN 10 MG PO TABS: 10.0000 mg | ORAL_TABLET | Freq: Three times a day (TID) | ORAL | 0 refills | Status: DC

## 2023-11-03 MED ORDER — DICLOFENAC SODIUM 75 MG PO TBEC: 75.0000 mg | DELAYED_RELEASE_TABLET | Freq: Two times a day (BID) | ORAL | 2 refills | Status: DC

## 2023-11-03 NOTE — Patient Instructions (Signed)
 Acute Back Pain, Adult Acute back pain is sudden and usually short-lived. It is often caused by an injury to the muscles and tissues in the back. The injury may result from: A muscle, tendon, or ligament getting overstretched or torn. Ligaments are tissues that connect bones to each other. Lifting something improperly can cause a back strain. Wear and tear (degeneration) of the spinal disks. Spinal disks are circular tissue that provide cushioning between the bones of the spine (vertebrae). Twisting motions, such as while playing sports or doing yard work. A hit to the back. Arthritis. You may have a physical exam, lab tests, and imaging tests to find the cause of your pain. Acute back pain usually goes away with rest and home care. Follow these instructions at home: Managing pain, stiffness, and swelling Take over-the-counter and prescription medicines only as told by your health care provider. Treatment may include medicines for pain and inflammation that are taken by mouth or applied to the skin, or muscle relaxants. Your health care provider may recommend applying ice during the first 24-48 hours after your pain starts. To do this: Put ice in a plastic bag. Place a towel between your skin and the bag. Leave the ice on for 20 minutes, 2-3 times a day. Remove the ice if your skin turns bright red. This is very important. If you cannot feel pain, heat, or cold, you have a greater risk of damage to the area. If directed, apply heat to the affected area as often as told by your health care provider. Use the heat source that your health care provider recommends, such as a moist heat pack or a heating pad. Place a towel between your skin and the heat source. Leave the heat on for 20-30 minutes. Remove the heat if your skin turns bright red. This is especially important if you are unable to feel pain, heat, or cold. You have a greater risk of getting burned. Activity  Do not stay in bed. Staying in  bed for more than 1-2 days can delay your recovery. Sit up and stand up straight. Avoid leaning forward when you sit or hunching over when you stand. If you work at a desk, sit close to it so you do not need to lean over. Keep your chin tucked in. Keep your neck drawn back, and keep your elbows bent at a 90-degree angle (right angle). Sit high and close to the steering wheel when you drive. Add lower back (lumbar) support to your car seat, if needed. Take short walks on even surfaces as soon as you are able. Try to increase the length of time you walk each day. Do not sit, drive, or stand in one place for more than 30 minutes at a time. Sitting or standing for long periods of time can put stress on your back. Do not drive or use heavy machinery while taking prescription pain medicine. Use proper lifting techniques. When you bend and lift, use positions that put less stress on your back: [place] your knees. Keep the load close to your body. Avoid twisting. Exercise regularly as told by your health care provider. Exercising helps your back heal faster and helps prevent back injuries by keeping muscles strong and flexible. Work with a physical therapist to make a safe exercise program, as recommended by your health care provider. Do any exercises as told by your physical therapist. Lifestyle Maintain a healthy weight. Extra weight puts stress on your back and makes it difficult to have good  posture. Avoid activities or situations that make you feel anxious or stressed. Stress and anxiety increase muscle tension and can make back pain worse. Learn ways to manage anxiety and stress, such as through exercise. General instructions Sleep on a firm mattress in a comfortable position. Try lying on your side with your knees slightly bent. If you lie on your back, put a pillow under your knees. Keep your head and neck in a straight line with your spine (neutral position) when using electronic equipment like  smartphones or pads. To do this: Raise your smartphone or pad to look at it instead of bending your head or neck to look down. Put the smartphone or pad at the level of your face while looking at the screen. Follow your treatment plan as told by your health care provider. This may include: Cognitive or behavioral therapy. Acupuncture or massage therapy. Meditation or yoga. Contact a health care provider if: You have pain that is not relieved with rest or medicine. You have increasing pain going down into your legs or buttocks. Your pain does not improve after 2 weeks. You have pain at night. You lose weight without trying. You have a fever or chills. You develop nausea or vomiting. You develop abdominal pain. Get help right away if: You develop new bowel or bladder control problems. You have unusual weakness or numbness in your arms or legs. You feel faint. These symptoms may represent a serious problem that is an emergency. Do not wait to see if the symptoms will go away. Get medical help right away. Call your local emergency services (911 in the [place] Do not drive yourself to the hospital. Summary Acute back pain is sudden and usually short-lived. Use proper lifting techniques. When you bend and lift, use positions that put less stress on your back. Take over-the-counter and prescription medicines only as told by your health care provider, and apply heat or ice as told. This information is not intended to replace advice given to you by your health care provider. Make sure you discuss any questions you have with your health care provider. Document Revised: 12/04/2020 Document Reviewed: 12/04/2020 Elsevier Patient Education  2024 [employer].

## 2023-11-03 NOTE — Progress Notes (Signed)
 Subjective:    Patient ID: Anna Richardson, female    DOB: 10-Oct-1975, 48 y.o.   MRN: [id number]  Chief Complaint  Patient presents with   Back Pain    Since Monday - picked up kid - comes around into her hip    Pt presents to the office today with lower back pain that started 5 days ago after picking up a child at work.  Back Pain This is a new problem. The current episode started 1 to 4 weeks ago. The problem occurs intermittently. The problem is unchanged. The pain is present in the lumbar spine. The quality of the pain is described as aching. The pain is at a severity of 7/10. The pain is moderate. The symptoms are aggravated by bending and twisting. Associated symptoms include leg pain and tingling. The treatment provided mild relief.      Review of Systems  Musculoskeletal:  Positive for back pain.  Neurological:  Positive for tingling.  All other systems reviewed and are negative.   Social History   Socioeconomic History   Marital status: Married    Spouse name: Not on file   Number of children: Not on file   Years of education: Not on file   Highest education level: Not on file  Occupational History   Not on file  Tobacco Use   Smoking status: Every Day    Current packs/day: 0.50    Average packs/day: 0.5 packs/day for 10.0 years (5.0 ttl pk-yrs)    Types: Cigarettes   Smokeless tobacco: Never  Vaping Use   Vaping status: Never Used  Substance and Sexual Activity   Alcohol use: Yes    Alcohol/week: 2.0 standard drinks of alcohol    Types: 2 Cans of beer per week   Drug use: No   Sexual activity: Yes  Other Topics Concern   Not on file  Social History Narrative   Not on file   Social Drivers of Health   Financial Resource Strain: Not on file  Food Insecurity: Not on file  Transportation Needs: Not on file  Physical Activity: Not on file  Stress: Not on file  Social Connections: Not on file   Family History  Problem Relation Age of Onset    Cancer Mother 93       breast         Objective:   Physical Exam Vitals reviewed.  Constitutional:      General: She is not in acute distress.    Appearance: She is well-developed.  HENT:     Head: Normocephalic and atraumatic.     Right Ear: Tympanic membrane normal.     Left Ear: Tympanic membrane normal.  Eyes:     Pupils: Pupils are equal, round, and reactive to light.  Neck:     Thyroid: No thyromegaly.  Cardiovascular:     Rate and Rhythm: Normal rate and regular rhythm.     Heart sounds: Normal heart sounds. No murmur heard. Pulmonary:     Effort: Pulmonary effort is normal. No respiratory distress.     Breath sounds: Normal breath sounds. No wheezing.  Abdominal:     General: Bowel sounds are normal. There is no distension.     Palpations: Abdomen is soft.     Tenderness: There is no abdominal tenderness.  Musculoskeletal:        General: No tenderness. Normal range of motion.     Cervical back: Normal range of motion and neck supple.  Comments: Pain in lower left lumbar with flexion  Skin:    General: Skin is warm and dry.  Neurological:     Mental Status: She is alert and oriented to person, place, and time.     Cranial Nerves: No cranial nerve deficit.     Deep Tendon Reflexes: Reflexes are normal and symmetric.  Psychiatric:        Behavior: Behavior normal.        Thought Content: Thought content normal.        Judgment: Judgment normal.       BP 103/68   Pulse 69   Temp 98.3 F (36.8 C)   Ht 5' 4 (1.626 m)   Wt 203 lb 12.8 oz (92.4 kg)   SpO2 97%   BMI 34.98 kg/m      Assessment & Plan:  Anna Richardson comes in today with chief complaint of Back Pain (Since Monday - picked up kid - comes around into her hip )   Diagnosis and orders addressed:  1. Acute right-sided low back pain with left-sided sciatica (Primary) Rest Ice  ROM exercises  No other NSAID's while taking diclofenac   Sedation precautions discussed with baclofen   -  diclofenac  (VOLTAREN ) 75 MG EC tablet; Take 1 tablet (75 mg total) by mouth 2 (two) times daily.  Dispense: 60 tablet; Refill: 2 - baclofen  (LIORESAL ) 10 MG tablet; Take 1 tablet (10 mg total) by mouth 3 (three) times daily.  Dispense: 30 each; Refill: 0     Bari Learn, FNP

## 2023-11-15 ENCOUNTER — Encounter: Payer: Self-pay | Admitting: Family Medicine

## 2024-01-04 ENCOUNTER — Telehealth: Admitting: Physician Assistant

## 2024-01-04 DIAGNOSIS — L710 Perioral dermatitis: Secondary | ICD-10-CM

## 2024-01-04 MED ORDER — FLUCONAZOLE 150 MG PO TABS: 150.0000 mg | ORAL_TABLET | Freq: Every day | ORAL | 0 refills | Status: DC

## 2024-01-04 MED ORDER — DOXYCYCLINE HYCLATE 100 MG PO TABS: 100.0000 mg | ORAL_TABLET | Freq: Two times a day (BID) | ORAL | 0 refills | Status: AC

## 2024-01-04 NOTE — Progress Notes (Signed)
 I have spent 5 minutes in review of e-visit questionnaire, review and updating patient chart, medical decision making and response to patient.   Piedad Climes, PA-C

## 2024-01-04 NOTE — Progress Notes (Signed)
 E Visit for Rash  We are sorry that you are not feeling well. Here is how we plan to help!  I agree this is very consistent with a perioral dermatitis. Try to keep the skin clean and dry, avoiding topical steroid creams as they can worsen this over time. I have prescribed a 2 week course of Doxycycline twice daily as well as a Diflucan. Please follow-up with your PCP for ongoing management.   HOME CARE:  Take cool showers and avoid direct sunlight. Apply cool compress or wet dressings. Take a bath in an oatmeal bath.  Sprinkle content of one Aveeno packet under running faucet with comfortably warm water.  Bathe for 15-20 minutes, 1-2 times daily.  Pat dry with a towel. Do not rub the rash. Use hydrocortisone cream. Take an antihistamine like Benadryl for widespread rashes that itch.  The adult dose of Benadryl is 25-50 mg by mouth 4 times daily. Caution:  This type of medication may cause sleepiness.  Do not drink alcohol, drive, or operate dangerous machinery while taking antihistamines.  Do not take these medications if you have prostate enlargement.  Read package instructions thoroughly on all medications that you take.  GET HELP RIGHT AWAY IF:  Symptoms don't go away after treatment. Severe itching that persists. If you rash spreads or swells. If you rash begins to smell. If it blisters and opens or develops a yellow-brown crust. You develop a fever. You have a sore throat. You become short of breath.  MAKE SURE YOU:  Understand these instructions. Will watch your condition. Will get help right away if you are not doing well or get worse.  Thank you for choosing an e-visit.  Your e-visit answers were reviewed by a board certified advanced clinical practitioner to complete your personal care plan. Depending upon the condition, your plan could have included both over the counter or prescription medications.  Please review your pharmacy choice. Make sure the pharmacy is open so  you can pick up prescription now. If there is a problem, you may contact your provider through [employer] and have the prescription routed to another pharmacy.  Your safety is important to [place]. If you have drug allergies check your prescription carefully.   For the next 24 hours you can use MyChart to ask questions about today's visit, request a non-urgent call back, or ask for a work or school excuse. You will get an email in the next two days asking about your experience. I hope that your e-visit has been valuable and will speed your recovery.

## 2024-02-14 ENCOUNTER — Encounter: Payer: BC Managed Care – PPO | Admitting: Family Medicine

## 2024-06-13 ENCOUNTER — Encounter (INDEPENDENT_AMBULATORY_CARE_PROVIDER_SITE_OTHER): Payer: Self-pay | Admitting: Family Medicine

## 2024-06-13 DIAGNOSIS — R7301 Impaired fasting glucose: Secondary | ICD-10-CM

## 2024-06-14 MED ORDER — ZEPBOUND 5 MG/0.5ML ~~LOC~~ SOLN: 5.0000 mg | SUBCUTANEOUS | 0 refills | Status: DC

## 2024-06-14 MED ORDER — ZEPBOUND 2.5 MG/0.5ML ~~LOC~~ SOLN: 2.5000 mg | SUBCUTANEOUS | 0 refills | Status: DC

## 2024-06-14 MED ORDER — ZEPBOUND 7.5 MG/0.5ML ~~LOC~~ SOLN: 7.5000 mg | SUBCUTANEOUS | 0 refills | Status: DC

## 2024-06-14 NOTE — Telephone Encounter (Signed)
 Please see the MyChart message reply(ies) for my assessment and plan.    This patient gave consent for this Medical Advice Message and is aware that it may result in a bill to [employer], as well as the possibility of receiving a bill for a co-payment or deductible. They are an established patient, but are not seeking medical advice exclusively about a problem treated during an in person or video visit in the last seven days. I did not recommend an in person or video visit within seven days of my reply.    I spent a total of 7 minutes cumulative time within 7 days through [employer].  Norene Fielding, DO

## 2024-07-02 ENCOUNTER — Telehealth: Payer: Self-pay | Admitting: Family Medicine

## 2024-07-02 NOTE — Telephone Encounter (Signed)
 LMTCB to let pt know that we can see her for Med refill on 09-24-2024 w/Dr G, but not for CPE. It needs to be 30 minute appt as CPE for office visit type.

## 2024-07-09 ENCOUNTER — Other Ambulatory Visit: Payer: Self-pay | Admitting: Family Medicine

## 2024-07-09 DIAGNOSIS — R7301 Impaired fasting glucose: Secondary | ICD-10-CM

## 2024-08-14 ENCOUNTER — Other Ambulatory Visit: Payer: Self-pay | Admitting: Family Medicine

## 2024-08-14 DIAGNOSIS — R7301 Impaired fasting glucose: Secondary | ICD-10-CM

## 2024-08-16 NOTE — Telephone Encounter (Signed)
 Is she needing to switch from the vials or is she asking for refill on the 7.5 mg.  She is not supposed to start the 7.5 mg until mid December so I am a little confused. Should not be due for refill on any dose until 10/13/2024, hence the 12/30 appt for further titration of med.

## 2024-09-24 ENCOUNTER — Ambulatory Visit: Admitting: Family Medicine

## 2024-09-24 ENCOUNTER — Ambulatory Visit: Payer: Self-pay | Admitting: Family Medicine

## 2024-09-24 ENCOUNTER — Encounter: Payer: Self-pay | Admitting: Family Medicine

## 2024-09-24 VITALS — BP 126/82 | HR 81 | Temp 97.8°F | Ht 64.0 in | Wt 193.4 lb

## 2024-09-24 DIAGNOSIS — Z720 Tobacco use: Secondary | ICD-10-CM

## 2024-09-24 DIAGNOSIS — Z713 Dietary counseling and surveillance: Secondary | ICD-10-CM

## 2024-09-24 DIAGNOSIS — Z0000 Encounter for general adult medical examination without abnormal findings: Secondary | ICD-10-CM | POA: Diagnosis not present

## 2024-09-24 DIAGNOSIS — E669 Obesity, unspecified: Secondary | ICD-10-CM

## 2024-09-24 DIAGNOSIS — Z716 Tobacco abuse counseling: Secondary | ICD-10-CM | POA: Diagnosis not present

## 2024-09-24 DIAGNOSIS — F411 Generalized anxiety disorder: Secondary | ICD-10-CM

## 2024-09-24 DIAGNOSIS — E7800 Pure hypercholesterolemia, unspecified: Secondary | ICD-10-CM | POA: Diagnosis not present

## 2024-09-24 DIAGNOSIS — Z789 Other specified health status: Secondary | ICD-10-CM | POA: Diagnosis not present

## 2024-09-24 LAB — BAYER DCA HB A1C WAIVED: HB A1C (BAYER DCA - WAIVED): 4.9 % (ref 4.8–5.6)

## 2024-09-24 MED ORDER — ZEPBOUND 7.5 MG/0.5ML ~~LOC~~ SOLN: 7.5000 mg | SUBCUTANEOUS | 3 refills | Status: AC

## 2024-09-24 MED ORDER — ZEPBOUND 5 MG/0.5ML ~~LOC~~ SOLN: 5.0000 mg | SUBCUTANEOUS | 0 refills | Status: AC

## 2024-09-24 MED ORDER — CITALOPRAM HYDROBROMIDE 40 MG PO TABS: 40.0000 mg | ORAL_TABLET | Freq: Every day | ORAL | 3 refills | Status: AC

## 2024-09-24 NOTE — Progress Notes (Addendum)
 "  Anna Richardson is a 48 y.o. female presents to office today for annual physical exam examination.    Doing well.  She voices no concerns with regards to Zepbound .  She feels like it is working well and has reduced her weight from 215 pounds to 193 pounds.  She is currently injecting 5 mg weekly.  Has had some intermittent GI issues with certain foods but otherwise is really not having any adverse side effects.  Denies any abdominal pain, nausea, vomiting, blood in stool.  She continues to use a diaphragm for contraception.  Continues to have regular menstrual cycles.  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  She has been working on keeping a moderate to high protein diet and trying to stay hydrated.  She does drink about 3 glasses of wine twice per week and continues smoke 1/2 pack/day.  She denies any hemoptysis, shortness of breath, claudication of the lower extremities or hands.  She denies any difficulty swallowing, change in voice.  No rectal bleeding.  No abnormal vaginal bleeding.  No skin lesions.  She is contemplative about cessation   Health Maintenance Due  Topic Date Due   Hepatitis B Vaccines 19-59 Average Risk (1 of 3 - 19+ 3-dose series) Never done   Mammogram  03/18/2022    Immunization History  Administered Date(s) Administered   PFIZER(Purple Top)SARS-COV-2 Vaccination 12/01/2019, 12/22/2019   PPD Test 08/12/2015, 08/10/2016, 10/16/2017   Tdap 05/30/2018   Unspecified SARS-COV-2 Vaccination 12/26/2019, 01/24/2020   Past Medical History:  Diagnosis Date   Anxiety    situational   Family history of adverse reaction to anesthesia    N&V   Stutter    Social History   Socioeconomic History   Marital status: Married    Spouse name: Not on file   Number of children: Not on file   Years of education: Not on file   Highest education level: Not on file  Occupational History   Not  on file  Tobacco Use   Smoking status: Every Day    Current packs/day: 0.50    Average packs/day: 0.5 packs/day for 10.0 years (5.0 ttl pk-yrs)    Types: Cigarettes   Smokeless tobacco: Never  Vaping Use   Vaping status: Never Used  Substance and Sexual Activity   Alcohol use: Yes    Alcohol/week: 2.0 standard drinks of alcohol    Types: 2 Cans of beer per week   Drug use: No   Sexual activity: Yes  Other Topics Concern   Not on file  Social History Narrative   Not on file   Social Drivers of Health   Tobacco Use: High Risk (09/24/2024)   Patient History    Smoking Tobacco Use: Every Day    Smokeless Tobacco Use: Never    Passive Exposure: Not on file  Financial Resource Strain: Not on file  Food Insecurity: Not on file  Transportation Needs: Not on file  Physical Activity: Not on file  Stress: Not on file  Social Connections: Not on file  Intimate Partner Violence: Not on file  Depression (EYV7-0): Low Risk (09/24/2024)   Depression (PHQ2-9)    PHQ-2 Score: 0  Alcohol Screen: Not on file  Housing: Not on file  Utilities: Not on file  Health Literacy: Not on file   Past Surgical History:  Procedure Laterality Date   CESAREAN SECTION  CHOLECYSTECTOMY N/A 06/04/2020   Procedure: LAPAROSCOPIC CHOLECYSTECTOMY;  Surgeon: Kinsinger, Herlene Righter, MD;  Location: [facility];  Service: General;  Laterality: N/A;   Family History  Problem Relation Age of Onset   Cancer Mother 53       breast    Current Medications[1]  Allergies[2]   ROS: Review of Systems Pertinent items noted in HPI and remainder of comprehensive ROS otherwise negative.    Physical exam BP 126/82   Pulse 81   Temp 97.8 F (36.6 C)   Ht 5' 4 (1.626 m)   Wt 193 lb 6 oz (87.7 kg)   SpO2 96%   BMI 33.19 kg/m  General appearance: alert, cooperative, appears stated age, no distress, and mildly obese Head: Normocephalic, without obvious abnormality, atraumatic Eyes: negative findings: lids and  lashes normal, conjunctivae and sclerae normal, corneas clear, and pupils equal, round, reactive to light and accomodation Ears: normal TM's and external ear canals both ears Nose: Nares normal. Septum midline. Mucosa normal. No drainage or sinus tenderness. Throat: lips, mucosa, and tongue normal; teeth and gums normal Neck: no adenopathy, no carotid bruit, supple, symmetrical, trachea midline, and thyroid not enlarged, symmetric, no tenderness/mass/nodules Back: symmetric, no curvature. ROM normal. No CVA tenderness. Lungs: clear to auscultation bilaterally Heart: regular rate and rhythm, S1, S2 normal, no murmur, click, rub or gallop Abdomen: soft, non-tender; bowel sounds normal; no masses,  no organomegaly Extremities: extremities normal, atraumatic, no cyanosis or edema Pulses: 2+ and symmetric Skin: Skin color, texture, turgor normal. No rashes or lesions Lymph nodes: No supraclavicular or anterior cervical lymph node enlargement Neurologic: Has a slight stutter but otherwise no focal neurologic deficits     09/24/2024    2:57 PM 11/03/2023    8:14 AM 06/01/2022    8:04 AM  Depression screen PHQ 2/9  Decreased Interest 0 0 0  Down, Depressed, Hopeless 0 0 0  PHQ - 2 Score 0 0 0  Altered sleeping 0 0 0  Tired, decreased energy 0 0 0  Change in appetite 0 0 0  Feeling bad or failure about yourself  0 0 0  Trouble concentrating 0 0 0  Moving slowly or fidgety/restless 0 0 0  Suicidal thoughts 0 0 0  PHQ-9 Score 0 0  0   Difficult doing work/chores Not difficult at all Not difficult at all Not difficult at all     Data saved with a previous flowsheet row definition      09/24/2024    2:57 PM 11/03/2023    8:14 AM 06/01/2022    8:04 AM 02/25/2022    3:40 PM  GAD 7 : Generalized Anxiety Score  Nervous, Anxious, on Edge 0 0 0 2  Control/stop worrying 1 0 0 0  Worry too much - different things 1 0 0 0  Trouble relaxing 0 0 0 0  Restless 0 0 0 0  Easily annoyed or irritable 1 0 0  1  Afraid - awful might happen 0 0 0 1  Total GAD 7 Score 3 0 0 4  Anxiety Difficulty Somewhat difficult Not difficult at all Not difficult at all Somewhat difficult    No results found for this or any previous visit (from the past 2160 hours).   Assessment/ Plan: Anna Richardson here for annual physical exam.   Annual physical exam  Tobacco abuse - Plan: CMP14+EGFR, CBC with Differential  Tobacco abuse counseling  Generalized anxiety disorder - Plan: CMP14+EGFR, citalopram  (CELEXA ) 40 MG tablet  Obesity (BMI 30-39.9) - Plan: CMP14+EGFR, Lipid Panel, TSH, Bayer DCA Hb A1c Waived, VITAMIN D  25 Hydroxy (Vit-D Deficiency, Fractures), tirzepatide  (ZEPBOUND ) 7.5 MG/0.5ML injection vial, tirzepatide  (ZEPBOUND ) 5 MG/0.5ML injection vial  Encounter for weight loss counseling - Plan: CMP14+EGFR  Pure hypercholesterolemia - Plan: CMP14+EGFR, Lipid Panel, TSH, tirzepatide  (ZEPBOUND ) 7.5 MG/0.5ML injection vial, tirzepatide  (ZEPBOUND ) 5 MG/0.5ML injection vial  Hepatitis B vaccination status unknown - Plan: Hepatitis B surface antibody,quantitative   Nonfasting labs collected today.  She declined influenza vaccination.  We counseled her on tobacco cessation.  She is having an excellent response to the Zepbound  with over a 20 pound weight loss since initiation.  I have renewed current dosages for ongoing use.  She will message me in 3 months with interval weight.  Mood stable.  Celexa  renewed  Check hepatitis B immunity  Counseled on healthy lifestyle choices, including diet (rich in fruits, vegetables and lean meats and low in salt and simple carbohydrates) and exercise (at least 30 minutes of moderate physical activity daily).  Patient to follow up 1 year for annual physical, 6 months for weight check  Melady Chow M. Layah Skousen, DO        [1]  Current Outpatient Medications:    citalopram  (CELEXA ) 40 MG tablet, Take 1 tablet (40 mg total) by mouth daily., Disp: 90 tablet, Rfl: 3    albuterol  (VENTOLIN  HFA) 108 (90 Base) MCG/ACT inhaler, Inhale 2 puffs into the lungs every 6 (six) hours as needed for wheezing or shortness of breath. (Patient not taking: Reported on 09/24/2024), Disp: 8 g, Rfl: 0   tirzepatide  (ZEPBOUND ) 5 MG/0.5ML injection vial, Inject 5 mg into the skin every 7 (seven) days. One more refill of 5mg  then will increase to 7.5mg , Disp: 2 mL, Rfl: 0   tirzepatide  (ZEPBOUND ) 7.5 MG/0.5ML injection vial, Inject 7.5 mg into the skin every 7 (seven) days., Disp: 6 mL, Rfl: 3 [2]  Allergies Allergen Reactions   Ciprofloxacin Swelling    EYE DROPS - Throat swelling   "

## 2024-09-24 NOTE — Addendum Note (Signed)
 Addended by: JOLINDA NORENE HERO on: 09/24/2024 03:16 PM   Modules accepted: Orders

## 2024-09-24 NOTE — Patient Instructions (Signed)
 Get mammogram done. We talked about smoking cessation/ flu shot recommendations today. Zepbound  5mg  refilled and 7.5mg  has 1 year of refills on it as well. Message me in 3 months with weight and how you're doing and if you want to increase further or not.

## 2024-09-25 ENCOUNTER — Ambulatory Visit: Payer: Self-pay | Admitting: Family Medicine

## 2024-09-25 DIAGNOSIS — E559 Vitamin D deficiency, unspecified: Secondary | ICD-10-CM

## 2024-09-25 LAB — CMP14+EGFR
ALT: 14 IU/L (ref 0–32)
AST: 18 IU/L (ref 0–40)
Albumin: 4.2 g/dL (ref 3.9–4.9)
Alkaline Phosphatase: 93 IU/L (ref 41–116)
BUN/Creatinine Ratio: 16 (ref 9–23)
BUN: 11 mg/dL (ref 6–24)
Bilirubin Total: 0.3 mg/dL (ref 0.0–1.2)
CO2: 21 mmol/L (ref 20–29)
Calcium: 9.1 mg/dL (ref 8.7–10.2)
Chloride: 103 mmol/L (ref 96–106)
Creatinine, Ser: 0.69 mg/dL (ref 0.57–1.00)
Globulin, Total: 2.7 g/dL (ref 1.5–4.5)
Glucose: 88 mg/dL (ref 70–99)
Potassium: 4.5 mmol/L (ref 3.5–5.2)
Sodium: 139 mmol/L (ref 134–144)
Total Protein: 6.9 g/dL (ref 6.0–8.5)
eGFR: 108 mL/min/1.73

## 2024-09-25 LAB — LIPID PANEL
Chol/HDL Ratio: 3.2 ratio (ref 0.0–4.4)
Cholesterol, Total: 174 mg/dL (ref 100–199)
HDL: 54 mg/dL
LDL Chol Calc (NIH): 103 mg/dL — ABNORMAL HIGH (ref 0–99)
Triglycerides: 91 mg/dL (ref 0–149)
VLDL Cholesterol Cal: 17 mg/dL (ref 5–40)

## 2024-09-25 LAB — CBC WITH DIFFERENTIAL/PLATELET
Basophils Absolute: 0.1 x10E3/uL (ref 0.0–0.2)
Basos: 1 %
EOS (ABSOLUTE): 0.1 x10E3/uL (ref 0.0–0.4)
Eos: 2 %
Hematocrit: 41.7 % (ref 34.0–46.6)
Hemoglobin: 14.1 g/dL (ref 11.1–15.9)
Immature Grans (Abs): 0 x10E3/uL (ref 0.0–0.1)
Immature Granulocytes: 0 %
Lymphocytes Absolute: 1.6 x10E3/uL (ref 0.7–3.1)
Lymphs: 24 %
MCH: 32.2 pg (ref 26.6–33.0)
MCHC: 33.8 g/dL (ref 31.5–35.7)
MCV: 95 fL (ref 79–97)
Monocytes Absolute: 0.3 x10E3/uL (ref 0.1–0.9)
Monocytes: 5 %
Neutrophils Absolute: 4.5 x10E3/uL (ref 1.4–7.0)
Neutrophils: 67 %
Platelets: 382 x10E3/uL (ref 150–450)
RBC: 4.38 x10E6/uL (ref 3.77–5.28)
RDW: 12.1 % (ref 11.7–15.4)
WBC: 6.5 x10E3/uL (ref 3.4–10.8)

## 2024-09-25 LAB — TSH: TSH: 1.15 u[IU]/mL (ref 0.450–4.500)

## 2024-09-25 LAB — VITAMIN D 25 HYDROXY (VIT D DEFICIENCY, FRACTURES): Vit D, 25-Hydroxy: 20.6 ng/mL — ABNORMAL LOW (ref 30.0–100.0)

## 2024-09-25 LAB — HEPATITIS B SURFACE ANTIBODY, QUANTITATIVE: Hepatitis B Surf Ab Quant: <3.5 m[IU]/mL — ABNORMAL LOW

## 2024-09-25 MED ORDER — VITAMIN D (ERGOCALCIFEROL) 1.25 MG (50000 UNIT) PO CAPS: 50000.0000 [IU] | ORAL_CAPSULE | ORAL | 0 refills | Status: AC

## 2024-10-28 ENCOUNTER — Other Ambulatory Visit: Payer: Self-pay | Admitting: Family Medicine

## 2024-10-28 DIAGNOSIS — E7800 Pure hypercholesterolemia, unspecified: Secondary | ICD-10-CM

## 2024-10-28 DIAGNOSIS — E669 Obesity, unspecified: Secondary | ICD-10-CM

## 2024-10-31 NOTE — Telephone Encounter (Signed)
 Called and left patient message to see what dose she wanted

## 2025-09-29 ENCOUNTER — Encounter: Admitting: Family Medicine
